# Patient Record
Sex: Female | Born: 1950
Health system: Southern US, Community
[De-identification: ages and names within clinical notes are randomized; demographics above are authoritative.]

## PROBLEM LIST (undated history)

## (undated) DIAGNOSIS — F32A Depression, unspecified: Secondary | ICD-10-CM

## (undated) DIAGNOSIS — N182 Chronic kidney disease, stage 2 (mild): Secondary | ICD-10-CM

## (undated) DIAGNOSIS — I10 Essential (primary) hypertension: Secondary | ICD-10-CM

## (undated) DIAGNOSIS — I6529 Occlusion and stenosis of unspecified carotid artery: Secondary | ICD-10-CM

## (undated) DIAGNOSIS — IMO0001 Reserved for inherently not codable concepts without codable children: Secondary | ICD-10-CM

## (undated) DIAGNOSIS — R8281 Pyuria: Secondary | ICD-10-CM

## (undated) DIAGNOSIS — R310 Gross hematuria: Secondary | ICD-10-CM

## (undated) DIAGNOSIS — R030 Elevated blood-pressure reading, without diagnosis of hypertension: Secondary | ICD-10-CM

## (undated) DIAGNOSIS — R351 Nocturia: Secondary | ICD-10-CM

## (undated) DIAGNOSIS — K130 Diseases of lips: Secondary | ICD-10-CM

## (undated) DIAGNOSIS — F419 Anxiety disorder, unspecified: Secondary | ICD-10-CM

## (undated) DIAGNOSIS — E785 Hyperlipidemia, unspecified: Secondary | ICD-10-CM

## (undated) DIAGNOSIS — R0989 Other specified symptoms and signs involving the circulatory and respiratory systems: Secondary | ICD-10-CM

## (undated) DIAGNOSIS — F329 Major depressive disorder, single episode, unspecified: Secondary | ICD-10-CM

## (undated) DIAGNOSIS — K5730 Diverticulosis of large intestine without perforation or abscess without bleeding: Secondary | ICD-10-CM

## (undated) DIAGNOSIS — M5430 Sciatica, unspecified side: Secondary | ICD-10-CM

## (undated) DIAGNOSIS — E559 Vitamin D deficiency, unspecified: Secondary | ICD-10-CM

## (undated) DIAGNOSIS — E538 Deficiency of other specified B group vitamins: Secondary | ICD-10-CM

## (undated) HISTORY — DX: Occlusion and stenosis of unspecified carotid artery: I65.29

## (undated) HISTORY — DX: Vitamin D deficiency, unspecified: E55.9

## (undated) HISTORY — DX: Diseases of lips: K13.0

## (undated) HISTORY — DX: Major depressive disorder, single episode, unspecified: F32.9

## (undated) HISTORY — DX: Sciatica, unspecified side: M54.30

## (undated) HISTORY — DX: Hyperlipidemia, unspecified: E78.5

## (undated) HISTORY — DX: Reserved for inherently not codable concepts without codable children: IMO0001

## (undated) HISTORY — DX: Other specified symptoms and signs involving the circulatory and respiratory systems: R09.89

## (undated) HISTORY — DX: Essential (primary) hypertension: I10

## (undated) HISTORY — DX: Gross hematuria: R31.0

## (undated) HISTORY — DX: Depression, unspecified: F32.A

## (undated) HISTORY — DX: Nocturia: R35.1

## (undated) HISTORY — DX: Anxiety disorder, unspecified: F41.9

## (undated) HISTORY — DX: Chronic kidney disease, stage 2 (mild): N18.2

## (undated) HISTORY — DX: Deficiency of other specified B group vitamins: E53.8

## (undated) HISTORY — DX: Elevated blood-pressure reading, without diagnosis of hypertension: R03.0

## (undated) HISTORY — DX: Diverticulosis of large intestine without perforation or abscess without bleeding: K57.30

## (undated) HISTORY — DX: Pyuria: R82.81

---
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
## 1985-03-06 HISTORY — PX: ABDOMINAL HYSTERECTOMY: SHX81

## 1986-03-06 HISTORY — PX: CARPAL TUNNEL RELEASE: SHX101

## 1986-03-06 HISTORY — PX: EXCISION NEUROMA: SHX6350

## 2013-09-03 ENCOUNTER — Ambulatory Visit: Payer: Self-pay | Admitting: Family Medicine

## 2013-12-04 DIAGNOSIS — K5730 Diverticulosis of large intestine without perforation or abscess without bleeding: Secondary | ICD-10-CM

## 2013-12-04 HISTORY — DX: Diverticulosis of large intestine without perforation or abscess without bleeding: K57.30

## 2013-12-10 DIAGNOSIS — E7800 Pure hypercholesterolemia, unspecified: Secondary | ICD-10-CM | POA: Insufficient documentation

## 2013-12-10 DIAGNOSIS — R0789 Other chest pain: Secondary | ICD-10-CM | POA: Insufficient documentation

## 2013-12-10 DIAGNOSIS — F331 Major depressive disorder, recurrent, moderate: Secondary | ICD-10-CM | POA: Insufficient documentation

## 2013-12-24 ENCOUNTER — Ambulatory Visit: Payer: Self-pay | Admitting: Family Medicine

## 2014-10-19 ENCOUNTER — Telehealth: Payer: Self-pay | Admitting: Family Medicine

## 2014-10-19 NOTE — Telephone Encounter (Signed)
Pt called in stated that she woke up Saturday and her bottom lip was swollen and would like a call back about what to do.

## 2014-10-20 NOTE — Telephone Encounter (Signed)
This is difficult to discern over the phone Could be aphthous ulcer, herpes simplex, or she could have bitten the inside of her mouth in her sleep, etc. If fever or purulent drainage, needs to be seen right away If painful, she can try Oragel or similar OTC medicine for gum and oral pain If lips are swollen and tongue swollen and trouble breathing, then that's a reason to go to ER We're happy to see her or she can go to urgent care if not improving

## 2014-10-20 NOTE — Telephone Encounter (Signed)
Spoke with patient, she has no fever, no drainage, no swelling of her tongue, no SOB or breathing difficulties. She will try oragel and if no improvement, will call back to schedule an appointment.

## 2014-10-20 NOTE — Telephone Encounter (Signed)
Bottom lip on left side has been swollen since Saturday. No SOB. Slightly tender to the touch. No new lipsticks/chapsticks, etc. Wants advice on what to do.

## 2014-11-30 DIAGNOSIS — I10 Essential (primary) hypertension: Secondary | ICD-10-CM | POA: Insufficient documentation

## 2014-11-30 DIAGNOSIS — I6529 Occlusion and stenosis of unspecified carotid artery: Secondary | ICD-10-CM | POA: Insufficient documentation

## 2014-11-30 DIAGNOSIS — R0989 Other specified symptoms and signs involving the circulatory and respiratory systems: Secondary | ICD-10-CM | POA: Insufficient documentation

## 2014-11-30 DIAGNOSIS — E538 Deficiency of other specified B group vitamins: Secondary | ICD-10-CM | POA: Insufficient documentation

## 2014-11-30 DIAGNOSIS — E785 Hyperlipidemia, unspecified: Secondary | ICD-10-CM | POA: Insufficient documentation

## 2014-11-30 DIAGNOSIS — F329 Major depressive disorder, single episode, unspecified: Secondary | ICD-10-CM | POA: Insufficient documentation

## 2014-11-30 DIAGNOSIS — K130 Diseases of lips: Secondary | ICD-10-CM | POA: Insufficient documentation

## 2014-11-30 DIAGNOSIS — F32A Depression, unspecified: Secondary | ICD-10-CM | POA: Insufficient documentation

## 2014-11-30 DIAGNOSIS — E559 Vitamin D deficiency, unspecified: Secondary | ICD-10-CM | POA: Insufficient documentation

## 2014-12-03 ENCOUNTER — Encounter: Payer: Self-pay | Admitting: Family Medicine

## 2014-12-03 ENCOUNTER — Ambulatory Visit (INDEPENDENT_AMBULATORY_CARE_PROVIDER_SITE_OTHER): Payer: Medicare Other | Admitting: Family Medicine

## 2014-12-03 VITALS — BP 134/84 | HR 90 | Temp 98.1°F | Ht 58.75 in | Wt 142.0 lb

## 2014-12-03 DIAGNOSIS — I10 Essential (primary) hypertension: Secondary | ICD-10-CM

## 2014-12-03 DIAGNOSIS — E785 Hyperlipidemia, unspecified: Secondary | ICD-10-CM | POA: Diagnosis not present

## 2014-12-03 DIAGNOSIS — Z23 Encounter for immunization: Secondary | ICD-10-CM

## 2014-12-03 DIAGNOSIS — M7062 Trochanteric bursitis, left hip: Secondary | ICD-10-CM | POA: Diagnosis not present

## 2014-12-03 DIAGNOSIS — I6523 Occlusion and stenosis of bilateral carotid arteries: Secondary | ICD-10-CM | POA: Diagnosis not present

## 2014-12-03 DIAGNOSIS — E559 Vitamin D deficiency, unspecified: Secondary | ICD-10-CM

## 2014-12-03 DIAGNOSIS — E538 Deficiency of other specified B group vitamins: Secondary | ICD-10-CM | POA: Diagnosis not present

## 2014-12-03 DIAGNOSIS — Z5181 Encounter for therapeutic drug level monitoring: Secondary | ICD-10-CM | POA: Diagnosis not present

## 2014-12-03 DIAGNOSIS — M7061 Trochanteric bursitis, right hip: Secondary | ICD-10-CM | POA: Diagnosis not present

## 2014-12-03 NOTE — Assessment & Plan Note (Addendum)
Recheck lipids today and sgpt on 80 mg atorvastatin; goal LDL less than 70; healthy eating discussed

## 2014-12-03 NOTE — Patient Instructions (Addendum)
If you need something for aches or pains, try to use Tylenol (acetaminphen) instead of non-steroidals (which include Aleve, ibuprofen, Advil, Motrin, and naproxen); non-steroidals can cause long-term kidney damage Try turmeric as a natural anti-inflammatory (for pain and arthritis). It comes in capsules where you buy aspirin and fish oil, but also as a spice where you buy pepper and garlic powder. Try to sleep on your back for a while until your hips feel better; you can try the tennis ball in the sock trick on the sleeves of your nightgown Ice topically over the hips for 15-20 minutes 3-4 times a day; thin cloth between ice and skin to avoid thermal injury Try to associate your pills with a well-established habit Try to limit saturated fats in your diet (bacon, sausage) Your goal blood pressure is less than 150 mmHg on top. Try to follow the DASH guidelines (DASH stands for Dietary Approaches to Stop Hypertension) Try to limit the sodium in your diet.  Ideally, consume less than 1.5 grams (less than 1,500mg ) per day. Do not add salt when cooking or at the table.  Check the sodium amount on labels when shopping, and choose items lower in sodium when given a choice. Avoid or limit foods that already contain a lot of sodium. Eat a diet rich in fruits and vegetables and whole grains. Please schedule a Medicare Wellness visit in the next few months You received the pneumonia vaccine called Prevnar, PCV-13 today; you do not need a booster of that for the rest of your life You should receive a different type of pneumonia vaccine called Pneumovax, PPSV-23 in 6-12 months You received the flu shot today; it should protect you against the flu virus over the coming months; it will take about two weeks for antibodies to develop; do try to stay away from hospitals, nursing homes, and daycares during peak flu season; taking 1000 mg of vitamin C daily during flu season may help you avoid getting sick I've ordered the  carotid scan If you have not heard anything from my staff in a week about any orders/referrals/studies from today, please contact us here to follow-up [phone]  Return in 6 months for regular follow-up and cholesterol  DASH Eating Plan DASH stands for "Dietary Approaches to Stop Hypertension." The DASH eating plan is a healthy eating plan that has been shown to reduce high blood pressure (hypertension). Additional health benefits may include reducing the risk of type 2 diabetes mellitus, heart disease, and stroke. The DASH eating plan may also help with weight loss. WHAT DO I NEED TO KNOW ABOUT THE DASH EATING PLAN? For the DASH eating plan, you will follow these general guidelines:  Choose foods with a percent daily value for sodium of less than 5% (as listed on the food label).  Use salt-free seasonings or herbs instead of table salt or sea salt.  Check with your health care provider or pharmacist before using salt substitutes.  Eat lower-sodium products, often labeled as "lower sodium" or "no salt added."  Eat fresh foods.  Eat more vegetables, fruits, and low-fat dairy products.  Choose whole grains. Look for the word "whole" as the first word in the ingredient list.  Choose fish and skinless chicken or [place] more often than red meat. Limit fish, poultry, and meat to 6 oz (170 g) each day.  Limit sweets, desserts, sugars, and sugary drinks.  Choose heart-healthy fats.  Limit cheese to 1 oz (28 g) per day.  Eat more home-cooked food  and less restaurant, buffet, and fast food.  Limit fried foods.  Cook foods using methods other than frying.  Limit canned vegetables. If you do use them, rinse them well to decrease the sodium.  When eating at a restaurant, ask that your food be prepared with less salt, or no salt if possible. WHAT FOODS CAN I EAT? Seek help from a dietitian for individual calorie needs. Grains Whole grain or whole wheat bread. Brown rice. Whole  grain or whole wheat pasta. Quinoa, bulgur, and whole grain cereals. Low-sodium cereals. Corn or whole wheat flour tortillas. Whole grain cornbread. Whole grain crackers. Low-sodium crackers. Vegetables Fresh or frozen vegetables (raw, steamed, roasted, or grilled). Low-sodium or reduced-sodium tomato and vegetable juices. Low-sodium or reduced-sodium tomato sauce and paste. Low-sodium or reduced-sodium canned vegetables.  Fruits All fresh, canned (in natural juice), or frozen fruits. Meat and Other Protein Products Ground beef (85% or leaner), grass-fed beef, or beef trimmed of fat. Skinless chicken or [place]. Ground chicken or [place]. Pork trimmed of fat. All fish and seafood. Eggs. Dried beans, peas, or lentils. Unsalted nuts and seeds. Unsalted canned beans. Dairy Low-fat dairy products, such as skim or 1% milk, 2% or reduced-fat cheeses, low-fat ricotta or cottage cheese, or plain low-fat yogurt. Low-sodium or reduced-sodium cheeses. Fats and Oils Tub margarines without trans fats. Light or reduced-fat mayonnaise and salad dressings (reduced sodium). Avocado. Safflower, olive, or canola oils. Natural peanut or almond butter. Other Unsalted popcorn and pretzels. The items listed above may not be a complete list of recommended foods or beverages. Contact your dietitian for more options. WHAT FOODS ARE NOT RECOMMENDED? Grains White bread. White pasta. White rice. Refined cornbread. Bagels and croissants. Crackers that contain trans fat. Vegetables Creamed or fried vegetables. Vegetables in a cheese sauce. Regular canned vegetables. Regular canned tomato sauce and paste. Regular tomato and vegetable juices. Fruits Dried fruits. Canned fruit in light or heavy syrup. Fruit juice. Meat and Other Protein Products Fatty cuts of meat. Ribs, chicken wings, bacon, sausage, bologna, salami, chitterlings, fatback, hot dogs, bratwurst, and packaged luncheon meats. Salted nuts and seeds. Canned beans with  salt. Dairy Whole or 2% milk, cream, half-and-half, and cream cheese. Whole-fat or sweetened yogurt. Full-fat cheeses or blue cheese. Nondairy creamers and whipped toppings. Processed cheese, cheese spreads, or cheese curds. Condiments Onion and garlic salt, seasoned salt, table salt, and sea salt. Canned and packaged gravies. Worcestershire sauce. Tartar sauce. Barbecue sauce. Teriyaki sauce. Soy sauce, including reduced sodium. Steak sauce. Fish sauce. Oyster sauce. Cocktail sauce. Horseradish. Ketchup and mustard. Meat flavorings and tenderizers. Bouillon cubes. Hot sauce. Tabasco sauce. Marinades. Taco seasonings. Relishes. Fats and Oils Butter, stick margarine, lard, shortening, ghee, and bacon fat. Coconut, palm kernel, or palm oils. Regular salad dressings. Other Pickles and olives. Salted popcorn and pretzels. The items listed above may not be a complete list of foods and beverages to avoid. Contact your dietitian for more information. WHERE CAN I FIND MORE INFORMATION? National Heart, Lung, and Blood Institute: travelstabloid.com Document Released: 02/09/2011 Document Revised: 07/07/2013 Document Reviewed: 12/25/2012 [facility] Patient Information 2015 [place], [place]. This information is not intended to replace advice given to you by your health care provider. Make sure you discuss any questions you have with your health care provider. Cholesterol Cholesterol is a white, waxy, fat-like substance needed by your body in small amounts. The liver makes all the cholesterol you need. Cholesterol is carried from the liver by the blood through the blood vessels. Deposits of cholesterol (plaque)  may build up on blood vessel walls. These make the arteries narrower and stiffer. Cholesterol plaques increase the risk for heart attack and stroke.  You cannot feel your cholesterol level even if it is very high. The only way to know it is high is with a blood test. Once you  know your cholesterol levels, you should keep a record of the test results. Work with your health care provider to keep your levels in the desired range.  WHAT DO THE RESULTS MEAN?  Total cholesterol is a rough measure of all the cholesterol in your blood.   LDL is the so-called bad cholesterol. This is the type that deposits cholesterol in the walls of the arteries. You want this level to be low.   HDL is the good cholesterol because it cleans the arteries and carries the LDL away. You want this level to be high.  Triglycerides are fat that the body can either burn for energy or store. High levels are closely linked to heart disease.  WHAT ARE THE DESIRED LEVELS OF CHOLESTEROL?  Total cholesterol below 200.   LDL below 100 for people at risk, below 70 for those at very high risk.   HDL above 50 is good, above 60 is best.   Triglycerides below 150.  HOW CAN I LOWER MY CHOLESTEROL?  Diet. Follow your diet programs as directed by your health care provider.   Choose fish or white meat chicken and [place], roasted or baked. Limit fatty cuts of red meat, fried foods, and processed meats, such as sausage and lunch meats.   Eat lots of fresh fruits and vegetables.  Choose whole grains, beans, pasta, potatoes, and cereals.   Use only small amounts of olive, corn, or canola oils.   Avoid butter, mayonnaise, shortening, or palm kernel oils.  Avoid foods with trans fats.   Drink skim or nonfat milk and eat low-fat or nonfat yogurt and cheeses. Avoid whole milk, cream, ice cream, egg yolks, and full-fat cheeses.   Healthy desserts include angel food cake, ginger snaps, animal crackers, hard candy, popsicles, and low-fat or nonfat frozen yogurt. Avoid pastries, cakes, pies, and cookies.   Exercise. Follow your exercise programs as directed by your health care provider.   A regular program helps decrease LDL and raise HDL.   A regular program helps with weight control.    Do things that increase your activity level like gardening, walking, or taking the stairs. Ask your health care provider about how you can be more active in your daily life.   Medicine. Take medicine only as directed by your health care provider.   Medicine may be prescribed by your health care provider to help lower cholesterol and decrease the risk for heart disease.   If you have several risk factors, you may need medicine even if your levels are normal. Document Released: 11/15/2000 Document Revised: 07/07/2013 Document Reviewed: 12/04/2012 [facility] Patient Information 2015 [place], [place]. This information is not intended to replace advice given to you by your health care provider. Make sure you discuss any questions you have with your health care provider.

## 2014-12-03 NOTE — Assessment & Plan Note (Signed)
Aspirin, statin, goal LDL ideally under 70; healthy diet encouraged

## 2014-12-03 NOTE — Progress Notes (Signed)
BP 134/84 mmHg  Pulse 90  Temp(Src) 98.1 F (36.7 C)  Ht 4' 10.75" (1.492 m)  Wt 142 lb (64.411 kg)  BMI 28.93 kg/m2  SpO2 98%   Subjective:    Patient ID: Sarah Gillespie, female    DOB: 06/18/1950, 64 y.o.   MRN: [id number]  HPI: Sarah Gillespie is a 64 y.o. female  Chief Complaint  Patient presents with  . Hypertension  . Hyperlipidemia  . Depression   She is here for follow-up of high cholesterol; last labs were checked in May, reviewed lipid panel with her; total cholesterol was 256 and LDL was 168; she is now on atorvastatin 80 mg daily; previous doses were 20 mg daily and then 40 mg daily; she is not having any problems with her medicines (no muscle aches, no abdominal pain); she misses her doses about half the time, forgets to take it a lot Diet review: hardly eats any eggs, just in baking; bacon once in a while, maybe once a week; does eat cheese, ham and cheese for lunch a few times a week; drinks whole milk just seldomly, baking or pudding or potatoes; does get a lot of veggies  Activity review: pretty sedentary because of the heat, but got a bicycle for Mother's Day; outdoor bike; does have a helmet  She has bilateral carotid atherosclerosis, mild on the right, minimal plaque on the left per carotid US report dated September 03, 2013; she has been stepped up on her statin since that last scan; no dizziness or headaches  Depression; chronic problem; on treatment right now, Pristiq and Wellbutrin; under good control with both medicines Depression screen [facility] 2/9 12/03/2014  Decreased Interest 0  Down, Depressed, Hopeless 0  PHQ - 2 Score 0   High blood pressure; last BP was 133/79 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[employer], broadcasting/film/video; she was talking about the rioting right before the BP was checked  Vitamin D deficiency and B12 deficiency; both mild; last labs in May showed vitamin D level of 27 and a B12 level of 373; she is taking supplements for both  Flu shot given today  Had a lot of pain on her  left side a few years ago; went to the arthritis doctor and he said it was her bursa, now on both hips; she had a positive ANA; both left and right are equal; first thought maybe change in the weather; hard to sleep, hurts when she lays on the side to sleep; she has tried ice; has not tried heat; she has tried tylenol and that helps a little  Update to family hx; niece has MS Sister had carotid blockages and polymyositis; other sister has thyroid disease  Relevant past medical, surgical, family and social history reviewed and updated as indicated. Interim medical history since our last visit reviewed. Allergies and medications reviewed and updated.  Review of Systems  Respiratory: Negative for shortness of breath.   Cardiovascular: Negative for chest pain.   Per HPI unless specifically indicated above     Objective:    BP 134/84 mmHg  Pulse 90  Temp(Src) 98.1 F (36.7 C)  Ht 4' 10.75" (1.492 m)  Wt 142 lb (64.411 kg)  BMI 28.93 kg/m2  SpO2 98%  Wt Readings from Last 3 Encounters:  12/03/14 142 lb (64.411 kg)  07/29/14 143 lb (64.864 kg)    Today's Vitals   12/03/14 1116 12/03/14 1203  BP: 166/89 134/84  Pulse: 90   Temp: 98.1 F (36.7 C)   Height: 4'  10.75" (1.492 m)   Weight: 142 lb (64.411 kg)   SpO2: 98%     Physical Exam  Constitutional: She appears well-developed and well-nourished. No distress.  HENT:  Head: Normocephalic and atraumatic.  Mouth/Throat: Mucous membranes are normal. Abnormal dentition (decay). Dental caries present.  Eyes: EOM are normal. No scleral icterus.  Neck: Carotid bruit is not present. No thyromegaly present.  Cardiovascular: Normal rate, regular rhythm and normal heart sounds.   No murmur heard. Pulmonary/Chest: Effort normal and breath sounds normal. No respiratory distress. She has no wheezes.  Abdominal: Soft. Bowel sounds are normal. She exhibits no distension.  Musculoskeletal: Normal range of motion. She exhibits no edema.        Right hip: She exhibits tenderness (over greater trochanter).       Left hip: She exhibits tenderness (over greater trochanter).  Neurological: She is alert. She exhibits normal muscle tone.  Skin: Skin is warm and dry. She is not diaphoretic. No pallor.  Psychiatric: She has a normal mood and affect. Her behavior is normal. Judgment and thought content normal.      Assessment & Plan:   Problem List Items Addressed This Visit      Cardiovascular and Mediastinum   Carotid atherosclerosis    Aspirin, statin, goal LDL ideally under 70; healthy diet encouraged      Relevant Orders   US Carotid Duplex Bilateral   Essential hypertension, benign    Recheck improved, controlled; continue med        Digestive   Vitamin B12 deficiency    Continue supplementation        Musculoskeletal and Integument   Trochanteric bursitis of both hips    Turmeric, tylenol, topicals; she may return to see arthritis doctor if desired        Other   Hyperlipidemia - Primary    Recheck lipids today and sgpt on 80 mg atorvastatin; goal LDL less than 70; healthy eating discussed      Relevant Orders   Lipid Panel w/o Chol/HDL Ratio   Vitamin D deficiency disease    Continue supplementation      Vaccine for streptococcus pneumoniae and influenza    Recommended PCV-13 today; given; recommended flu vaccine today; given; next flu shot in fall of 2017; she will not need another PCV-13 for life (per current ACIP guidelines)      Relevant Orders   Pneumococcal conjugate vaccine 13-valent (Completed)    Other Visit Diagnoses    Encounter for immunization        Medication monitoring encounter        Relevant Orders    Comprehensive metabolic panel (Completed)       Follow up plan: Return in about 6 months (around 06/02/2015) for regular visit and labs, but also schedule Medicare Wellness visit in next few months.  Orders Placed This Encounter  Procedures  . US Carotid Duplex Bilateral  . Flu  Vaccine QUAD 36+ mos IM  . Pneumococcal conjugate vaccine 13-valent  . Lipid Panel w/o Chol/HDL Ratio  . Comprehensive metabolic panel  . Lipid Panel w/o Chol/HDL Ratio   An after-visit summary was printed and given to the patient at [place].  Please see the patient instructions which may contain other information and recommendations beyond what is mentioned above in the assessment and plan.

## 2014-12-04 LAB — COMPREHENSIVE METABOLIC PANEL
A/G RATIO: 1.6 (ref 1.1–2.5)
ALBUMIN: 4.1 g/dL (ref 3.6–4.8)
ALK PHOS: 101 IU/L (ref 39–117)
ALT: 9 IU/L (ref 0–32)
AST: 15 IU/L (ref 0–40)
BILIRUBIN TOTAL: 0.3 mg/dL (ref 0.0–1.2)
BUN / CREAT RATIO: 21 (ref 11–26)
BUN: 22 mg/dL (ref 8–27)
CO2: 24 mmol/L (ref 18–29)
Calcium: 10.4 mg/dL — ABNORMAL HIGH (ref 8.7–10.3)
Chloride: 101 mmol/L (ref 97–108)
Creatinine, Ser: 1.03 mg/dL — ABNORMAL HIGH (ref 0.57–1.00)
GFR calc Af Amer: 65 mL/min/{1.73_m2} (ref >59)
GFR calc non Af Amer: 57 mL/min/{1.73_m2} — ABNORMAL LOW (ref >59)
GLUCOSE: 90 mg/dL (ref 65–99)
Globulin, Total: 2.6 g/dL (ref 1.5–4.5)
Potassium: 4.6 mmol/L (ref 3.5–5.2)
Sodium: 141 mmol/L (ref 134–144)
Total Protein: 6.7 g/dL (ref 6.0–8.5)

## 2014-12-04 LAB — LIPID PANEL W/O CHOL/HDL RATIO
Cholesterol, Total: 266 mg/dL — ABNORMAL HIGH (ref 100–199)
HDL: 61 mg/dL (ref >39)
LDL CALC: 189 mg/dL — AB (ref 0–99)
TRIGLYCERIDES: 79 mg/dL (ref 0–149)
VLDL Cholesterol Cal: 16 mg/dL (ref 5–40)

## 2014-12-07 DIAGNOSIS — M7061 Trochanteric bursitis, right hip: Secondary | ICD-10-CM | POA: Insufficient documentation

## 2014-12-07 DIAGNOSIS — M7062 Trochanteric bursitis, left hip: Secondary | ICD-10-CM

## 2014-12-07 NOTE — Assessment & Plan Note (Signed)
Continue supplementation  ?

## 2014-12-07 NOTE — Assessment & Plan Note (Signed)
Recommended PCV-13 today; given; recommended flu vaccine today; given; next flu shot in fall of 2017; she will not need another PCV-13 for life (per current ACIP guidelines)

## 2014-12-07 NOTE — Assessment & Plan Note (Signed)
Turmeric, tylenol, topicals; she may return to see arthritis doctor if desired

## 2014-12-07 NOTE — Assessment & Plan Note (Signed)
Recheck improved, controlled; continue med

## 2014-12-16 ENCOUNTER — Ambulatory Visit: Payer: Medicare Other

## 2014-12-16 ENCOUNTER — Ambulatory Visit (INDEPENDENT_AMBULATORY_CARE_PROVIDER_SITE_OTHER): Payer: Medicare Other | Admitting: Family Medicine

## 2014-12-16 ENCOUNTER — Encounter: Payer: Self-pay | Admitting: Family Medicine

## 2014-12-16 VITALS — BP 154/97 | HR 105 | Temp 98.9°F | Ht 59.0 in | Wt 143.0 lb

## 2014-12-16 DIAGNOSIS — Z1239 Encounter for other screening for malignant neoplasm of breast: Secondary | ICD-10-CM | POA: Diagnosis not present

## 2014-12-16 DIAGNOSIS — Z780 Asymptomatic menopausal state: Secondary | ICD-10-CM | POA: Diagnosis not present

## 2014-12-16 DIAGNOSIS — Z0000 Encounter for general adult medical examination without abnormal findings: Secondary | ICD-10-CM

## 2014-12-16 MED ORDER — LISINOPRIL 10 MG PO TABS: 10.0000 mg | ORAL_TABLET | Freq: Every day | ORAL | Status: DC

## 2014-12-16 MED ORDER — TRAMADOL HCL 50 MG PO TABS: 50.0000 mg | ORAL_TABLET | Freq: Four times a day (QID) | ORAL | Status: DC | PRN

## 2014-12-16 NOTE — Progress Notes (Signed)
Patient: Sarah Gillespie, Female    DOB: [birth year], 64 y.o.   MRN: [id number]  Visit Date: 12/16/2014  Today's Provider: Enid Derry, MD   Chief Complaint  Patient presents with  . Annual Exam    Medicare Annual Wellness Exam    Subjective:   Sarah Gillespie is a 64 y.o. female who presents today for her Subsequent Annual Wellness Visit.  Caregiver input: n/a   She has a new method for remembering cholesterol medicine; last lipid panel was with several missed doses and we'll recheck in 3 months now that she's back on her medicine regularly; we reviewed her last LDL which was high (189 on Sept 29, 2016) BP high today Pain in the hips and radiating down the right leg; goes to the ankle; tried tylenol; no weakness or numbness or tingling; just hurts, worse with standing; no rash; no bowel or bladder dysfunction  Alcohol: occasional alcohol, less than guidelines Depression screen [facility] 2/9 12/16/2014 12/03/2014  Decreased Interest 0 0  Down, Depressed, Hopeless 0 0  PHQ - 2 Score 0 0  HTN: high today; checks away from doctor, but can't remember numbers; family hx is positive, mother had HTN Obesity: n/a, mildly overweight but gaining a little; sister has thyroid problems, father had thyroid problems Tobacco: n/a HIV, hepatitis B, hep C: declined Cervical cancer: s/p hysterectomy, endometriosis Lipids and glucose: recently done and discussed Colon cancer: patient declined; gave her stool cards DEXA: ordered Breast cancer: ordered AAA: n/a Aspirin; already taking Fall prevention; taking vitamin D, practices fall precautions Diet: typical American eater or worse Skin cancer: no tanning beds, does not bake in the sun Exercise: not plenty of activity, getting ready to ride bike  HPI  Review of Systems  Past Medical History  Diagnosis Date  . Hyperlipidemia   . Depression   . Angular cheilitis   . Vitamin B12 deficiency   . Vitamin D deficiency disease   . Carotid bruit   .  Carotid atherosclerosis   . Elevated blood pressure   . Sigmoid diverticulosis Oct. 2015    CT scan   Past Surgical History  Procedure Laterality Date  . Appendectomy  1960  . Abdominal hysterectomy  1987  . Tonsillectomy  1962  . Carpal tunnel release Left 1988  . Excision neuroma Left 1988   Family History  Problem Relation Age of Onset  . Hypertension Mother   . Heart disease Mother   . Clotting disorder Mother   . Other Sister     muscle disease  . Cancer Brother 50    lung, brain cancer  . ALS Father   . Diabetes Maternal Grandmother   . Stroke Brother   . Thyroid disease Sister   . Clotting disorder Sister     Social History   Social History  . Marital Status: Unknown    Spouse Name: N/A  . Number of Children: N/A  . Years of Education: N/A   Occupational History  . Not on file.   Social History Main Topics  . Smoking status: Never Smoker   . Smokeless tobacco: Never Used  . Alcohol Use: Yes     Comment: occasional wine  . Drug Use: No  . Sexual Activity: Not on file   Other Topics Concern  . Not on file   Social History Narrative    Outpatient Encounter Prescriptions as of 12/16/2014  Medication Sig  . aspirin EC 81 MG tablet Take 81 mg by mouth daily.  [place] Kitchen atorvastatin (  LIPITOR) 80 MG tablet Take 80 mg by mouth at bedtime.  Mar[place] Kitchen buPROPion (WELLBUTRIN XL) 300 MG 24 hr tablet Take 300 mg by mouth daily.  Marland Kitchen CALCIUM PO Take by mouth daily.   . Cholecalciferol (VITAMIN D) 2000 UNITS CAPS Take 2,000 Units by mouth daily.  Marland Kitchen desvenlafaxine (PRISTIQ) 100 MG 24 hr tablet Take 100 mg by mouth daily.  Marland Kitchen nystatin cream (MYCOSTATIN) Apply 1 application topically 2 (two) times daily as needed for dry skin.   No facility-administered encounter medications on file as of 12/16/2014.    Functional Ability / Safety Screening 1.  Was the timed Get Up and Go test longer than 30 seconds?  no 2.  Does the patient need help with the phone, transportation, shopping,       preparing meals, housework, laundry, medications, or managing money?  no 3.  Does the patient's home have:  loose throw rugs in the hallway?   no      Grab bars in the bathroom? no      Handrails on the stairs?   yes      Poor lighting?   no 4.  Has the patient noticed any hearing difficulties?   no  Fall Risk Assessment See under rooming  Depression Screen See under rooming Depression screen [facility] 2/9 12/16/2014 12/03/2014  Decreased Interest 0 0  Down, Depressed, Hopeless 0 0  PHQ - 2 Score 0 0   Advanced Directives Does patient have a HCPOA?    no If yes, name and contact information: would be Lucrezia Starch, [phone] Does patient have a living will or MOST form?  no  Objective:   Vitals: BP 154/97 mmHg  Pulse 105  Temp(Src) 98.9 F (37.2 C)  Ht 4\' 11"  (1.499 m)  Wt 143 lb (64.864 kg)  BMI 28.87 kg/m2  SpO2 97% Body mass index is 28.87 kg/(m^2).  Visual Acuity Screening   Right eye Left eye Both eyes  Without correction:     With correction: 20/50 20/25     Physical Exam  Constitutional: She appears well-developed and well-nourished.  Cardiovascular: Tachycardia present.   Pulmonary/Chest: Effort normal.  Neurological: She is alert. She displays no atrophy and no tremor. She exhibits normal muscle tone. Coordination and gait normal.  Psychiatric: She has a normal mood and affect. Her speech is normal and behavior is normal. Judgment and thought content normal. Cognition and memory are normal.   Mood/affect:  euthymic Appearance:  Casually dressed  Cognitive Testing - 6-CIT  Correct? Score   What year is it? yes 0 Yes = 0    No = 4  What month is it? yes 0 Yes = 0    No = 3  Remember:     Pia Mau, [place], [place]     What time is it? yes 0 Yes = 0    No = 3  Count backwards from 20 to 1 yes 0 Correct = 0    1 error = 2   More than 1 error = 4  Say the months of the year in reverse. yes 0 Correct = 0    1 error = 2   More than 1 error = 4  What  address did I ask you to remember? no 2 Correct = 0  1 error = 2    2 error = 4    3 error = 6    4 error = 8    All wrong =  10       TOTAL SCORE  2/28   Interpretation:  Normal  Normal (0-7) Abnormal (8-28)    Assessment & Plan:     Annual Wellness Visit  Reviewed patient's Family Medical History Reviewed and updated list of patient's medical providers Assessment of cognitive impairment was done Assessed patient's functional ability Established a written schedule for health screening [place] Completed and Reviewed  Exercise Activities and Dietary recommendations Goals    None      Immunization History  Administered Date(s) Administered  . Influenza,inj,Quad PF,36+ Mos 12/03/2014  . Pneumococcal Conjugate-13 12/03/2014  . Zoster 05/22/2013    Health Maintenance  Topic Date Due  . Hepatitis C Screening  February 06, 1951  . TETANUS/TDAP  09/03/1967  . MAMMOGRAM  09/03/1998  . DEXA SCAN  09/02/2013  . COLONOSCOPY  12/16/2015 (Originally 09/03/1998)  . INFLUENZA VACCINE  10/05/2015  . PNA vac Low Risk Adult (2 of 2 - PPSV23) 12/03/2015  . ZOSTAVAX  Completed     Discussed health benefits of physical activity, and encouraged her to engage in regular exercise appropriate for her age and condition.   No orders of the defined types were placed in this encounter.    Current outpatient prescriptions:  .  aspirin EC 81 MG tablet, Take 81 mg by mouth daily., Disp: , Rfl:  .  atorvastatin (LIPITOR) 80 MG tablet, Take 80 mg by mouth at bedtime., Disp: , Rfl:  .  buPROPion (WELLBUTRIN XL) 300 MG 24 hr tablet, Take 300 mg by mouth daily., Disp: , Rfl:  .  CALCIUM PO, Take by mouth daily. , Disp: , Rfl:  .  Cholecalciferol (VITAMIN D) 2000 UNITS CAPS, Take 2,000 Units by mouth daily., Disp: , Rfl:  .  desvenlafaxine (PRISTIQ) 100 MG 24 hr tablet, Take 100 mg by mouth daily., Disp: , Rfl:  .  nystatin cream (MYCOSTATIN), Apply 1 application topically 2 (two)  times daily as needed for dry skin., Disp: , Rfl:  There are no discontinued medications.  Next Medicare Wellness Visit in 12+ months  Problem List Items Addressed This Visit      Other   Breast cancer screening   Relevant Orders   MM DIGITAL SCREENING BILATERAL   Postmenopausal   Relevant Orders   DG Bone Density    Other Visit Diagnoses    Visit for preventive health examination    -  Primary

## 2014-12-16 NOTE — Patient Instructions (Addendum)
Let's start the new blood pressure medicine Avoid fruit smoothies and salt substitutes Return in 3 weeks for blood pressure check and BMP and thyroid test Try the DASH guidelines  Please do call to schedule your mammogram; the number to schedule one at either [facility] or [facility] is [phone]  Return the stool cards at your earliest convenience; if you change your mind and decide you are willing to have a colonoscopy, just call me and we'll get that scheduled  GINA --> please give patient the living will, health care power of attorney paperwork  If you get injured, please make an appointment right away to come in for a tetanus booster, or you can go to an urgent care  Try the new pain medicine and use if needed; if you develop signs or symptoms of serotonin syndrome, then seek medical care immediately

## 2014-12-22 ENCOUNTER — Telehealth: Payer: Self-pay

## 2014-12-22 NOTE — Telephone Encounter (Signed)
I called to ask patient if she's had her Diabetic Eye Exam recently. She stated she had her eye exam this year at [facility] in [place] and got new glasses.  I contacted [facility] and asked them to fax over her last diabetic eye exam. She agreed and said she'd send it to [place] shortly.

## 2014-12-23 ENCOUNTER — Telehealth: Payer: Self-pay | Admitting: Family Medicine

## 2014-12-23 ENCOUNTER — Ambulatory Visit
Admission: RE | Admit: 2014-12-23 | Discharge: 2014-12-23 | Disposition: A | Discharge status: Home / Self Care | Payer: Medicare Other | Source: Ambulatory Visit | Attending: Family Medicine | Admitting: Family Medicine

## 2014-12-23 DIAGNOSIS — I6523 Occlusion and stenosis of bilateral carotid arteries: Secondary | ICD-10-CM | POA: Insufficient documentation

## 2014-12-23 NOTE — Telephone Encounter (Signed)
I called, left msg that I am calling about carotid scan results; similar to last year; will try her tomorrow IMPRESSION: Minimal to moderate amount of bilateral atherosclerotic plaque, right subjectively greater than left, morphologically similar to the 09/2013 examination and again not resulting in a hemodynamically significant stenosis within either internal carotid artery.

## 2014-12-29 NOTE — Telephone Encounter (Signed)
I left detailed message; similar to last time; limit saturated fats; stay on cholesterol medicine every day; call with questions

## 2015-01-06 ENCOUNTER — Ambulatory Visit: Payer: Medicare Other | Admitting: Family Medicine

## 2015-05-07 ENCOUNTER — Encounter: Payer: Self-pay | Admitting: *Deleted

## 2015-05-20 ENCOUNTER — Encounter: Payer: Self-pay | Admitting: Urology

## 2015-05-20 ENCOUNTER — Ambulatory Visit: Payer: Self-pay | Admitting: Urology

## 2015-06-02 ENCOUNTER — Ambulatory Visit: Payer: Medicare Other | Admitting: Family Medicine

## 2015-07-30 ENCOUNTER — Encounter: Payer: Self-pay | Admitting: Family Medicine

## 2015-07-30 ENCOUNTER — Ambulatory Visit (INDEPENDENT_AMBULATORY_CARE_PROVIDER_SITE_OTHER): Payer: Medicare Other | Admitting: Family Medicine

## 2015-07-30 VITALS — BP 132/78 | HR 93 | Temp 98.3°F | Resp 14 | Wt 145.0 lb

## 2015-07-30 DIAGNOSIS — Z1239 Encounter for other screening for malignant neoplasm of breast: Secondary | ICD-10-CM | POA: Diagnosis not present

## 2015-07-30 DIAGNOSIS — Z0000 Encounter for general adult medical examination without abnormal findings: Secondary | ICD-10-CM | POA: Insufficient documentation

## 2015-07-30 DIAGNOSIS — E538 Deficiency of other specified B group vitamins: Secondary | ICD-10-CM

## 2015-07-30 DIAGNOSIS — Z5181 Encounter for therapeutic drug level monitoring: Secondary | ICD-10-CM | POA: Diagnosis not present

## 2015-07-30 DIAGNOSIS — I6523 Occlusion and stenosis of bilateral carotid arteries: Secondary | ICD-10-CM

## 2015-07-30 DIAGNOSIS — R635 Abnormal weight gain: Secondary | ICD-10-CM

## 2015-07-30 DIAGNOSIS — E785 Hyperlipidemia, unspecified: Secondary | ICD-10-CM

## 2015-07-30 DIAGNOSIS — Z1159 Encounter for screening for other viral diseases: Secondary | ICD-10-CM

## 2015-07-30 DIAGNOSIS — Z780 Asymptomatic menopausal state: Secondary | ICD-10-CM

## 2015-07-30 DIAGNOSIS — E559 Vitamin D deficiency, unspecified: Secondary | ICD-10-CM | POA: Diagnosis not present

## 2015-07-30 DIAGNOSIS — I10 Essential (primary) hypertension: Secondary | ICD-10-CM

## 2015-07-30 MED ORDER — LISINOPRIL 10 MG PO TABS: 10.0000 mg | ORAL_TABLET | Freq: Every day | ORAL | Status: DC

## 2015-07-30 MED ORDER — ATORVASTATIN CALCIUM 80 MG PO TABS: 80.0000 mg | ORAL_TABLET | Freq: Every day | ORAL | Status: DC

## 2015-07-30 NOTE — Assessment & Plan Note (Signed)
Well-controlled; continue ACE-I; follow DASH guidelines

## 2015-07-30 NOTE — Assessment & Plan Note (Signed)
DEXA scan

## 2015-07-30 NOTE — Assessment & Plan Note (Signed)
Order mammo 

## 2015-07-30 NOTE — Progress Notes (Signed)
BP 132/78 mmHg  Pulse 93  Temp(Src) 98.3 F (36.8 C) (Oral)  Resp 14  Wt 145 lb (65.772 kg)  SpO2 94%   Subjective:    Patient ID: Sarah Gillespie, female    DOB: 05/20/1950, 65 y.o.   MRN: [id number]  HPI: Sarah Gillespie is a 65 y.o. female  Chief Complaint  Patient presents with  . Medication Refill    HTN; well-controlled today; occasionally forgets night-time med so asked if okay to take in the morning with her vitamins  High cholesterol; taking cholesterol medicine; last lipids revivewed; total 266, LDL 189, HDL 61  She gradually tapered off her depression medicine and stopped them completely months ago; she feels great She is having trouble remembering her night-time medicines; sleeping better; less constipation  Still having sciatica, from right buttock to ankle; just once in a while; worse with weather changes, rainy weather, dampness; no pain in the middle of the back; no loss of control of bowel or bladder  Carotid athero; nonsmoker; sister had carotid blockages IMPRESSION: Minimal to moderate amount of bilateral atherosclerotic plaque, right subjectively greater than left, morphologically similar to the 09/2013 examination and again not resulting in a hemodynamically significant stenosis within either internal carotid artery.  Electronically Signed  By: Sandi Mariscal M.D.  On: 12/23/2014 16:26 ------------------------  Pain on the right side lateral chest wall; does not feel like it's the breast, but behind and down the side; no rash, does not feel anything in the breast Depression screen [facility] 2/9 07/30/2015 12/16/2014 12/03/2014  Decreased Interest 0 0 0  Down, Depressed, Hopeless 0 0 0  PHQ - 2 Score 0 0 0   Relevant past medical, surgical, family and social history reviewed Past Medical History  Diagnosis Date  . Hyperlipidemia   . Depression   . Angular cheilitis   . Vitamin B12 deficiency   . Vitamin D deficiency disease   . Carotid bruit   .  Carotid atherosclerosis   . Elevated blood pressure   . Sigmoid diverticulosis Oct. 2015    CT scan  . Pyuria   . Anxiety   . Gross hematuria   . Frequency   . Nocturia    Past Surgical History  Procedure Laterality Date  . Appendectomy  1960  . Abdominal hysterectomy  1987  . Tonsillectomy  1962  . Carpal tunnel release Left 1988  . Excision neuroma Left 1988   Family History  Problem Relation Age of Onset  . Hypertension Mother   . Heart disease Mother   . Clotting disorder Mother   . Other Sister     muscle disease  . Cancer Brother 50    lung, brain cancer  . ALS Father   . Urolithiasis Father   . Thyroid disease Father   . Diabetes Maternal Grandmother   . Stroke Brother   . Thyroid disease Sister   . Clotting disorder Sister   MD addition: sister - hypothyroidism  Social History  Substance Use Topics  . Smoking status: Never Smoker   . Smokeless tobacco: Never Used  . Alcohol Use: Yes     Comment: occasional wine   Interim medical history since last visit reviewed. Allergies and medications reviewed  Review of Systems Per HPI unless specifically indicated above     Objective:    BP 132/78 mmHg  Pulse 93  Temp(Src) 98.3 F (36.8 C) (Oral)  Resp 14  Wt 145 lb (65.772 kg)  SpO2 94%  Wt Readings from  Last 3 Encounters:  07/30/15 145 lb (65.772 kg)  12/16/14 143 lb (64.864 kg)  12/03/14 142 lb (64.411 kg)    Physical Exam  Constitutional: She appears well-developed and well-nourished. No distress.  HENT:  Head: Normocephalic and atraumatic.  Mouth/Throat: Abnormal dentition (poor condition).  Eyes: EOM are normal. No scleral icterus.  Neck: Carotid bruit is not present. No thyromegaly present.  Cardiovascular: Normal rate, regular rhythm and normal heart sounds.   No murmur heard. Pulmonary/Chest: Effort normal and breath sounds normal. No respiratory distress. She has no wheezes. She exhibits tenderness (along right upper pectoral minor).  Right breast exhibits no inverted nipple, no mass, no nipple discharge, no skin change and no tenderness. Left breast exhibits no inverted nipple, no mass, no nipple discharge, no skin change and no tenderness. Breasts are symmetrical.  Abdominal: Soft. Bowel sounds are normal. She exhibits no distension.  Musculoskeletal: Normal range of motion. She exhibits no edema.  Neurological: She is alert. She exhibits normal muscle tone.  Skin: Skin is warm and dry. She is not diaphoretic. No pallor.  Psychiatric: She has a normal mood and affect. Her behavior is normal. Judgment and thought content normal.   Results for orders placed or performed in visit on 12/03/14  Comprehensive metabolic panel  Result Value Ref Range   Glucose 90 65 - 99 mg/dL   BUN 22 8 - 27 mg/dL   Creatinine, Ser 1.03 (H) 0.57 - 1.00 mg/dL   GFR calc non Af Amer 57 (L) >59 mL/min/1.73   GFR calc Af Amer 65 >59 mL/min/1.73   BUN/Creatinine Ratio 21 11 - 26   Sodium 141 134 - 144 mmol/L   Potassium 4.6 3.5 - 5.2 mmol/L   Chloride 101 97 - 108 mmol/L   CO2 24 18 - 29 mmol/L   Calcium 10.4 (H) 8.7 - 10.3 mg/dL   Total Protein 6.7 6.0 - 8.5 g/dL   Albumin 4.1 3.6 - 4.8 g/dL   Globulin, Total 2.6 1.5 - 4.5 g/dL   Albumin/Globulin Ratio 1.6 1.1 - 2.5   Bilirubin Total 0.3 0.0 - 1.2 mg/dL   Alkaline Phosphatase 101 39 - 117 IU/L   AST 15 0 - 40 IU/L   ALT 9 0 - 32 IU/L  Lipid Panel w/o Chol/HDL Ratio  Result Value Ref Range   Cholesterol, Total 266 (H) 100 - 199 mg/dL   Triglycerides 79 0 - 149 mg/dL   HDL 61 >39 mg/dL   VLDL Cholesterol Cal 16 5 - 40 mg/dL   LDL Calculated 189 (H) 0 - 99 mg/dL      Assessment & Plan:   Problem List Items Addressed This Visit      Cardiovascular and Mediastinum   Carotid atherosclerosis    On aspirin and high dose statin; reviewed last carotid scan; ordered US      Relevant Medications   atorvastatin (LIPITOR) 80 MG tablet   lisinopril (PRINIVIL,ZESTRIL) 10 MG tablet   Other  Relevant Orders   US Carotid Duplex Bilateral   Lipid Panel w/o Chol/HDL Ratio   Essential hypertension, benign    Well-controlled; continue ACE-I; follow DASH guidelines      Relevant Medications   atorvastatin (LIPITOR) 80 MG tablet   lisinopril (PRINIVIL,ZESTRIL) 10 MG tablet     Digestive   Vitamin B12 deficiency    500 or 1000 mcg B12 daily        Other   Breast cancer screening    Order mammo  Relevant Orders   MM DIGITAL SCREENING BILATERAL   Hyperlipidemia - Primary    Limit saturated fats, continue statin      Relevant Medications   atorvastatin (LIPITOR) 80 MG tablet   lisinopril (PRINIVIL,ZESTRIL) 10 MG tablet   Medication monitoring encounter   Relevant Orders   Comprehensive metabolic panel   Postmenopausal    DEXA scan      Relevant Orders   DG Bone Density   Vitamin D deficiency disease    Check vit D level       Other Visit Diagnoses    Weight gain        Relevant Orders    TSH    Need for hepatitis C screening test        Relevant Orders    Hepatitis C antibody       Follow up plan: Return in about 6 months (around 01/30/2016) for fasting labs and visit with Dr. Sanda Klein.  An after-visit summary was printed and given to the patient at [facility].  Please see the patient instructions which may contain other information and recommendations beyond what is mentioned above in the assessment and plan.  Meds ordered this encounter  Medications  . atorvastatin (LIPITOR) 80 MG tablet    Sig: Take 1 tablet (80 mg total) by mouth daily.    Dispense:  90 tablet    Refill:  3  . lisinopril (PRINIVIL,ZESTRIL) 10 MG tablet    Sig: Take 1 tablet (10 mg total) by mouth daily. For blood pressure    Dispense:  90 tablet    Refill:  3    Orders Placed This Encounter  Procedures  . US Carotid Duplex Bilateral  . DG Bone Density  . MM DIGITAL SCREENING BILATERAL  . Lipid Panel w/o Chol/HDL Ratio  . Hepatitis C antibody  . Comprehensive metabolic  panel  . TSH

## 2015-07-30 NOTE — Assessment & Plan Note (Signed)
500 or 1000 mcg B12 daily

## 2015-07-30 NOTE — Assessment & Plan Note (Signed)
Limit saturated fats, continue statin 

## 2015-07-30 NOTE — Assessment & Plan Note (Addendum)
On aspirin and high dose statin; reviewed last carotid scan; ordered US

## 2015-07-30 NOTE — Patient Instructions (Signed)
Try to limit saturated fats in your diet (bologna, hot dogs, barbeque, cheeseburgers, hamburgers, steak, bacon, sausage, cheese, etc.) and get more fresh fruits, vegetables, and whole grains Please do call to schedule your mammogram and bone density test; the number to schedule one at either [facility] or [facility] is [phone] Have fasting labs done at your convenience We'll order your carotid scan Do take 81 mg coated aspirin every day You can take your cholesterol medicine and blood pressure medicine in the morning If you have not heard anything from my staff in a week about any orders/referrals/studies from today, please contact us here to follow-up [phone] Have a lovely time at the beach :-)

## 2015-07-30 NOTE — Assessment & Plan Note (Signed)
Check vit D level. 

## 2015-08-10 LAB — TSH: TSH: 2.67 u[IU]/mL (ref 0.450–4.500)

## 2015-08-10 LAB — COMPREHENSIVE METABOLIC PANEL
ALBUMIN: 3.9 g/dL (ref 3.6–4.8)
ALK PHOS: 84 IU/L (ref 39–117)
ALT: 16 IU/L (ref 0–32)
AST: 19 IU/L (ref 0–40)
Albumin/Globulin Ratio: 1.6 (ref 1.2–2.2)
BUN / CREAT RATIO: 23 (ref 12–28)
BUN: 20 mg/dL (ref 8–27)
Bilirubin Total: 0.4 mg/dL (ref 0.0–1.2)
CALCIUM: 9 mg/dL (ref 8.7–10.3)
CHLORIDE: 104 mmol/L (ref 96–106)
CO2: 25 mmol/L (ref 18–29)
CREATININE: 0.86 mg/dL (ref 0.57–1.00)
GFR, EST AFRICAN AMERICAN: 81 mL/min/{1.73_m2} (ref >59)
GFR, EST NON AFRICAN AMERICAN: 71 mL/min/{1.73_m2} (ref >59)
GLOBULIN, TOTAL: 2.4 g/dL (ref 1.5–4.5)
GLUCOSE: 85 mg/dL (ref 65–99)
POTASSIUM: 4.5 mmol/L (ref 3.5–5.2)
Sodium: 142 mmol/L (ref 134–144)
TOTAL PROTEIN: 6.3 g/dL (ref 6.0–8.5)

## 2015-08-10 LAB — LIPID PANEL W/O CHOL/HDL RATIO
Cholesterol, Total: 175 mg/dL (ref 100–199)
HDL: 54 mg/dL (ref >39)
LDL Calculated: 99 mg/dL (ref 0–99)
Triglycerides: 110 mg/dL (ref 0–149)
VLDL Cholesterol Cal: 22 mg/dL (ref 5–40)

## 2015-09-09 ENCOUNTER — Ambulatory Visit (INDEPENDENT_AMBULATORY_CARE_PROVIDER_SITE_OTHER): Payer: Medicare Other | Admitting: Family Medicine

## 2015-09-09 ENCOUNTER — Encounter: Payer: Self-pay | Admitting: Family Medicine

## 2015-09-09 ENCOUNTER — Encounter (INDEPENDENT_AMBULATORY_CARE_PROVIDER_SITE_OTHER): Payer: Self-pay

## 2015-09-09 DIAGNOSIS — I6523 Occlusion and stenosis of bilateral carotid arteries: Secondary | ICD-10-CM | POA: Diagnosis not present

## 2015-09-09 DIAGNOSIS — S8391XA Sprain of unspecified site of right knee, initial encounter: Secondary | ICD-10-CM

## 2015-09-09 MED ORDER — HYDROCODONE-ACETAMINOPHEN 5-325 MG PO TABS: 1.0000 | ORAL_TABLET | ORAL | Status: DC | PRN

## 2015-09-09 NOTE — Patient Instructions (Addendum)
Continue to take your aspirin Do foot exercises to prevent a blood clot in the leg I do suspect that you have strained a ligament, so if not getting better after 2 weeks, call me and we'll refer to orthopaedist Practice good fall precautions If it locks, or worsens, then do call right away Please call us if you have not heard about your scan by Monday Use pain medicine as directed if needed Total daily tylenol (acetaminophen) should not exceed 3,000 mg per day Okay to take ibuprofen or aleve in addition if needed, but take your aspirin first thing in the morning and don't take any ibuprofen or aleve for at least one hour

## 2015-09-09 NOTE — Progress Notes (Signed)
BP 128/70   Pulse 99   Temp 98.7 F (37.1 C) (Oral)   Resp 16   Ht 4\' 11"  (1.499 m)   Wt 146 lb 9.6 oz (66.5 kg)   SpO2 95%   BMI 29.61 kg/m    Subjective:    Patient ID: Sarah Gillespie, female    DOB: 1950-12-08, 65 y.o.   MRN: [id number]  HPI: Sarah Gillespie is a 65 y.o. female  Chief Complaint  Patient presents with  . Knee Pain    follow up urgent care, right knee injury   She hurt her right knee over the weekend Went to [facility] on Monday Injury happened on Saturday; twisted the right leg while emptying groceries from cart to go to car Felt it immediately; no popping sound, no buckling Not much swelling, just a little noted by the provider Trying to keep it elevated Using a velcro brace on the knee They gave her some pain medicine Pain is a 6 out of 10 right now Only has four pain pills left  She has not heard anyting about her carotid scan; ordered in May  PHQ-2 score is zero  Relevant past medical, surgical, social history reviewed Past Medical History:  Diagnosis Date  . Angular cheilitis   . Anxiety   . Carotid atherosclerosis   . Carotid bruit   . Depression   . Elevated blood pressure   . Frequency   . Gross hematuria   . Hyperlipidemia   . Nocturia   . Pyuria   . Sigmoid diverticulosis Oct. 2015   CT scan  . Vitamin B12 deficiency   . Vitamin D deficiency disease    Past Surgical History:  Procedure Laterality Date  . ABDOMINAL HYSTERECTOMY  1987  . APPENDECTOMY  1960  . CARPAL TUNNEL RELEASE Left 1988  . EXCISION NEUROMA Left 1988  . TONSILLECTOMY  1962   Social History  Substance Use Topics  . Smoking status: Never Smoker  . Smokeless tobacco: Never Used  . Alcohol use Yes     Comment: occasional wine   Interim medical history since last visit reviewed. Allergies and medications reviewed  Review of Systems Per HPI unless specifically indicated above     Objective:    BP 128/70   Pulse 99   Temp 98.7 F (37.1 C)  (Oral)   Resp 16   Ht 4\' 11"  (1.499 m)   Wt 146 lb 9.6 oz (66.5 kg)   SpO2 95%   BMI 29.61 kg/m     Physical Exam  Constitutional: She appears well-developed and well-nourished.  Cardiovascular: Normal rate.   Pulmonary/Chest: Effort normal.  Musculoskeletal:       Right knee: She exhibits decreased range of motion and swelling. She exhibits no ecchymosis, no deformity, no erythema, no LCL laxity and no MCL laxity. Tenderness found.  Psychiatric: She has a normal mood and affect.      Assessment & Plan:   Problem List Items Addressed This Visit      Cardiovascular and Mediastinum   Carotid atherosclerosis    Will ask staff to check on this order from previous visit; if she needs to use NSAID for pain relief for her knee, I advised her to take her aspirin at least one hour prior to the NSAID for full anti-platelet effects (need the aspirin to irreversibly acetylate the cyclo-oxygenase before the NSAIDs hits thromboxane A2)        Musculoskeletal and Integument   Knee sprain and strain  Suspect strain; do not think tear; will treat conservatively; see after visit summary for advice; patient to call in a few weeks if not better; advised calf exercises to help lessen chance of blood clot       Other Visit Diagnoses   None.     Follow up plan: Return if symptoms worsen or fail to improve.  An after-visit summary was printed and given to the patient at [facility].  Please see the patient instructions which may contain other information and recommendations beyond what is mentioned above in the assessment and plan.  Meds ordered this encounter  Medications  . DISCONTD: HYDROcodone-acetaminophen (NORCO/VICODIN) 5-325 MG tablet  . DISCONTD: HYDROcodone-acetaminophen (NORCO/VICODIN) 5-325 MG tablet    Sig: Take 1 tablet by mouth every 4 (four) hours as needed for moderate pain.    Dispense:  20 tablet    Refill:  0    No orders of the defined types were placed in this  encounter.

## 2015-09-16 ENCOUNTER — Ambulatory Visit
Admission: RE | Admit: 2015-09-16 | Discharge: 2015-09-16 | Disposition: A | Discharge status: Home / Self Care | Payer: Medicare Other | Source: Ambulatory Visit | Attending: Family Medicine | Admitting: Family Medicine

## 2015-09-16 DIAGNOSIS — I6523 Occlusion and stenosis of bilateral carotid arteries: Secondary | ICD-10-CM | POA: Diagnosis present

## 2015-10-08 ENCOUNTER — Encounter: Payer: Self-pay | Admitting: Family Medicine

## 2015-10-08 ENCOUNTER — Ambulatory Visit (INDEPENDENT_AMBULATORY_CARE_PROVIDER_SITE_OTHER): Payer: Medicare Other | Admitting: Family Medicine

## 2015-10-08 DIAGNOSIS — F32A Depression, unspecified: Secondary | ICD-10-CM

## 2015-10-08 DIAGNOSIS — M5430 Sciatica, unspecified side: Secondary | ICD-10-CM | POA: Insufficient documentation

## 2015-10-08 DIAGNOSIS — F329 Major depressive disorder, single episode, unspecified: Secondary | ICD-10-CM

## 2015-10-08 DIAGNOSIS — M5431 Sciatica, right side: Secondary | ICD-10-CM | POA: Diagnosis not present

## 2015-10-08 MED ORDER — VENLAFAXINE HCL ER 37.5 MG PO CP24: 37.5000 mg | ORAL_CAPSULE | Freq: Every day | ORAL | 0 refills | Status: DC

## 2015-10-08 MED ORDER — MELOXICAM 7.5 MG PO TABS: 7.5000 mg | ORAL_TABLET | Freq: Every day | ORAL | 0 refills | Status: DC | PRN

## 2015-10-08 NOTE — Progress Notes (Signed)
BP 132/78   Pulse 100   Temp 98.1 F (36.7 C) (Oral)   Resp 14   Wt 144 lb (65.3 kg)   SpO2 95%   BMI 29.08 kg/m    Subjective:    Patient ID: Sarah Gillespie, female    DOB: February 24, 1951, 65 y.o.   MRN: [id number]  HPI: Kathiria Parthasarathy is a 65 y.o. female  Chief Complaint  Patient presents with  . Depression   Patient is here for evaluation of depression; long hx; she decided to take herself off of her medicines months ago and was doing well for a while Started to get depressed 2 weeks ago; tearfulness, no irritability Appetite is the same Does dwell on things; mind will race at night, thoughts are going from one to the other Hard time falling asleep at night; trouble staying asleep; naps in the afternoon No mania or hypomania though when we reviewed symptoms She has been on multiple meds; even had ECT ("shock treatments"); some outpatient, some inpatient; previous suicide attempt with overdose, no thoughts now of hurting herself or anyone else  She also has sciatica; juts down the right leg; worse at night; a little back pain; no B/B dysfunction; she has not done PT; no weakness in the right leg  Depression screen [facility] 2/9 10/08/2015 09/09/2015 07/30/2015 12/16/2014 12/03/2014  Decreased Interest 1 0 0 0 0  Down, Depressed, Hopeless 3 0 0 0 0  PHQ - 2 Score 4 0 0 0 0  Altered sleeping 3 - - - -  Tired, decreased energy 3 - - - -  Change in appetite 0 - - - -  Feeling bad or failure about yourself  0 - - - -  Trouble concentrating 1 - - - -  Moving slowly or fidgety/restless 0 - - - -  Suicidal thoughts 0 - - - -  PHQ-9 Score 11 - - - -  Difficult doing work/chores Somewhat difficult - - - -   Not really any anxiety; GAD-7 not done  Relevant past medical, surgical, family and social history reviewed Past Medical History:  Diagnosis Date  . Angular cheilitis   . Anxiety   . Carotid atherosclerosis   . Carotid bruit   . Depression   . Elevated blood pressure   .  Frequency   . Gross hematuria   . Hyperlipidemia   . Nocturia   . Pyuria   . Sigmoid diverticulosis Oct. 2015   CT scan  . Vitamin B12 deficiency   . Vitamin D deficiency disease    Family History  Problem Relation Age of Onset  . Hypertension Mother   . Heart disease Mother   . Clotting disorder Mother   . Other Sister     muscle disease  . Cancer Brother 50    lung, brain cancer  . ALS Father   . Urolithiasis Father   . Thyroid disease Father   . Diabetes Maternal Grandmother   . Stroke Brother   . Thyroid disease Sister   . Clotting disorder Sister    Social History  Substance Use Topics  . Smoking status: Never Smoker  . Smokeless tobacco: Never Used  . Alcohol use Yes     Comment: occasional wine   Interim medical history since last visit reviewed. Allergies and medications reviewed  Review of Systems Per HPI unless specifically indicated above     Objective:    BP 132/78   Pulse 100   Temp 98.1 F (36.7  C) (Oral)   Resp 14   Wt 144 lb (65.3 kg)   SpO2 95%   BMI 29.08 kg/m   Wt Readings from Last 3 Encounters:  10/08/15 144 lb (65.3 kg)  09/09/15 146 lb 9.6 oz (66.5 kg)  07/30/15 145 lb (65.8 kg)    Physical Exam  Constitutional: She appears well-developed and well-nourished. No distress.  Eyes: EOM are normal. No scleral icterus.  Neck: No thyromegaly present.  Cardiovascular: Normal rate.   Pulmonary/Chest: Effort normal.  Abdominal: She exhibits no distension.  Skin: No pallor.  Psychiatric: Her behavior is normal. Judgment and thought content normal. Her mood appears not anxious. Her affect is blunt. Her affect is not labile and not inappropriate. Her speech is not delayed, not tangential and not slurred. She is not agitated, not aggressive, not slowed and not withdrawn. Cognition and memory are not impaired. She does not express impulsivity or inappropriate judgment. She exhibits a depressed mood (mildly depressed, not tearful, not  despondent). She expresses no homicidal and no suicidal ideation.  Good eye contact with examiner     Results for orders placed or performed in visit on 07/30/15  Lipid Panel w/o Chol/HDL Ratio  Result Value Ref Range   Cholesterol, Total 175 100 - 199 mg/dL   Triglycerides 110 0 - 149 mg/dL   HDL 54 >39 mg/dL   VLDL Cholesterol Cal 22 5 - 40 mg/dL   LDL Calculated 99 0 - 99 mg/dL  Comprehensive metabolic panel  Result Value Ref Range   Glucose 85 65 - 99 mg/dL   BUN 20 8 - 27 mg/dL   Creatinine, Ser 0.86 0.57 - 1.00 mg/dL   GFR calc non Af Amer 71 >59 mL/min/1.73   GFR calc Af Amer 81 >59 mL/min/1.73   BUN/Creatinine Ratio 23 12 - 28   Sodium 142 134 - 144 mmol/L   Potassium 4.5 3.5 - 5.2 mmol/L   Chloride 104 96 - 106 mmol/L   CO2 25 18 - 29 mmol/L   Calcium 9.0 8.7 - 10.3 mg/dL   Total Protein 6.3 6.0 - 8.5 g/dL   Albumin 3.9 3.6 - 4.8 g/dL   Globulin, Total 2.4 1.5 - 4.5 g/dL   Albumin/Globulin Ratio 1.6 1.2 - 2.2   Bilirubin Total 0.4 0.0 - 1.2 mg/dL   Alkaline Phosphatase 84 39 - 117 IU/L   AST 19 0 - 40 IU/L   ALT 16 0 - 32 IU/L  TSH  Result Value Ref Range   TSH 2.670 0.450 - 4.500 uIU/mL      Assessment & Plan:   Problem List Items Addressed This Visit      Nervous and Auditory   Sciatica    Refer to PT; try turmeric; Rx for anti-inflammatory short-term      Relevant Medications   meloxicam (MOBIC) 7.5 MG tablet   venlafaxine XR (EFFEXOR XR) 37.5 MG 24 hr capsule   Other Relevant Orders   Ambulatory referral to Physical Therapy     Other   Depression    Tried multiple meds, and we'll start back on effexor; verbal contract for safety      Relevant Medications   venlafaxine XR (EFFEXOR XR) 37.5 MG 24 hr capsule    Other Visit Diagnoses   None.     Follow up plan: Return in about 2 weeks (around 10/21/2015) for follow-up of meds.  An after-visit summary was printed and given to the patient at [place].  Please see the patient instructions  which may contain other information and recommendations beyond what is mentioned above in the assessment and plan.  Meds ordered this encounter  Medications  . meloxicam (MOBIC) 7.5 MG tablet    Sig: Take 1 tablet (7.5 mg total) by mouth daily as needed for pain.    Dispense:  30 tablet    Refill:  0  . venlafaxine XR (EFFEXOR XR) 37.5 MG 24 hr capsule    Sig: Take 1 capsule (37.5 mg total) by mouth daily with breakfast. For four days, then two a day    Dispense:  60 capsule    Refill:  0    Orders Placed This Encounter  Procedures  . Ambulatory referral to Physical Therapy

## 2015-10-08 NOTE — Assessment & Plan Note (Addendum)
Refer to PT; try turmeric; Rx for anti-inflammatory short-term

## 2015-10-08 NOTE — Patient Instructions (Addendum)
Check out [facility]  [place] [phone]  Start the new medicines If you have any dark thoughts, then please seek help

## 2015-10-08 NOTE — Assessment & Plan Note (Signed)
Tried multiple meds, and we'll start back on effexor; verbal contract for safety

## 2015-10-18 ENCOUNTER — Telehealth: Payer: Self-pay | Admitting: Family Medicine

## 2015-10-18 NOTE — Telephone Encounter (Signed)
Erroneous encounter

## 2015-10-20 ENCOUNTER — Telehealth: Payer: Self-pay | Admitting: Family Medicine

## 2015-10-21 NOTE — Telephone Encounter (Signed)
I called patient back and Dr. Ancil Boozer had already left early for the day so I could not ask her.  But the patient states having trouble with the Effexor since upping the dose to bid has been experiencing symptoms of weakness in legs(states it feels like jello) so she is going to back down on it to qd until you return.

## 2015-10-25 NOTE — Telephone Encounter (Signed)
I reviewed note from when I was away on vacation Left message, returning her call If any dark thoughts, seek help immediately We'll talk soon and figure out what to do with her medicine

## 2015-10-28 ENCOUNTER — Ambulatory Visit: Payer: Medicare Other | Admitting: Family Medicine

## 2015-10-29 MED ORDER — CELECOXIB 100 MG PO CAPS: 100.0000 mg | ORAL_CAPSULE | Freq: Two times a day (BID) | ORAL | 2 refills | Status: DC

## 2015-10-29 MED ORDER — VENLAFAXINE HCL ER 37.5 MG PO CP24: 37.5000 mg | ORAL_CAPSULE | Freq: Every day | ORAL | 5 refills | Status: DC

## 2015-10-29 NOTE — Addendum Note (Signed)
Addended by: LADA, Satira Anis on: 10/29/2015 05:44 PM   Modules accepted: Orders

## 2015-10-29 NOTE — Telephone Encounter (Signed)
I talked with patient; she said to go back to just one pill of effexor; had been shaky and nauseated; no energy; legs felt like rubber; dehydrated; started hydrating better She is doing better on the one pill now Anti-inflammatory made her sick so she stopped; she has failed meloxicam, naproxen, and ibuprofen; will try celebrex; may need prior auth She is feeling better over all

## 2015-11-04 ENCOUNTER — Ambulatory Visit (INDEPENDENT_AMBULATORY_CARE_PROVIDER_SITE_OTHER): Payer: Medicare Other | Admitting: Family Medicine

## 2015-11-04 VITALS — BP 118/74 | HR 82 | Temp 98.3°F | Resp 14 | Wt 143.0 lb

## 2015-11-04 DIAGNOSIS — M7062 Trochanteric bursitis, left hip: Secondary | ICD-10-CM | POA: Diagnosis not present

## 2015-11-04 DIAGNOSIS — M7061 Trochanteric bursitis, right hip: Secondary | ICD-10-CM

## 2015-11-04 DIAGNOSIS — F329 Major depressive disorder, single episode, unspecified: Secondary | ICD-10-CM | POA: Diagnosis not present

## 2015-11-04 DIAGNOSIS — Z23 Encounter for immunization: Secondary | ICD-10-CM

## 2015-11-04 DIAGNOSIS — F32A Depression, unspecified: Secondary | ICD-10-CM

## 2015-11-04 NOTE — Patient Instructions (Addendum)
You received the flu shot today; it should protect you against the flu virus over the coming months; it will take about two weeks for antibodies to develop; do try to stay away from hospitals, nursing homes, and daycares during peak flu season; taking extra vitamin C daily during flu season may help you avoid getting sick  Try over-the-counter hydrocortisone on the spots on your arm and neck  Monitor your symptoms, especially through shorter days, and call if you'd like that dose increased back up Do get 20-30 minutes of sunlight a day, especially through winter

## 2015-11-04 NOTE — Progress Notes (Signed)
BP 118/74   Pulse 82   Temp 98.3 F (36.8 C) (Oral)   Resp 14   Wt 143 lb (64.9 kg)   SpO2 95%   BMI 28.88 kg/m    Subjective:    Patient ID: Sarah Gillespie, female    DOB: May 16, 1950, 65 y.o.   MRN: [id number]  HPI: Sarah Gillespie is a 65 y.o. female  Chief Complaint  Patient presents with  . Follow-up    2 weeks   She is on the SNRI; reacted to the increase of the SNRI, might have been too rapid of a jump up; just felt lousy, ached all over, wanted to sleep constantly; no appetite, just wiped out; no thoughts of self-harm or hurting others; motivation to shower and cook; not isolating  BP well-controlled  On celebrex, got approved and taking; not having any stomach upset, aches and pains improved  She agrees to flu shot today  She has some dry spots on her skin  Depression screen [facility] 2/9 11/04/2015 10/08/2015 09/09/2015 07/30/2015 12/16/2014  Decreased Interest 0 1 0 0 0  Down, Depressed, Hopeless 1 3 0 0 0  PHQ - 2 Score 1 4 0 0 0  Altered sleeping - 3 - - -  Tired, decreased energy - 3 - - -  Change in appetite - 0 - - -  Feeling bad or failure about yourself  - 0 - - -  Trouble concentrating - 1 - - -  Moving slowly or fidgety/restless - 0 - - -  Suicidal thoughts - 0 - - -  PHQ-9 Score - 11 - - -  Difficult doing work/chores - Somewhat difficult - - -   Relevant past medical, surgical, family and social history reviewed Past Medical History:  Diagnosis Date  . Angular cheilitis   . Anxiety   . Carotid atherosclerosis   . Carotid bruit   . Depression   . Elevated blood pressure   . Frequency   . Gross hematuria   . Hyperlipidemia   . Nocturia   . Pyuria   . Sigmoid diverticulosis Oct. 2015   CT scan  . Vitamin B12 deficiency   . Vitamin D deficiency disease    Social History  Substance Use Topics  . Smoking status: Never Smoker  . Smokeless tobacco: Never Used  . Alcohol use Yes     Comment: occasional wine   Interim medical history since  last visit reviewed. Allergies and medications reviewed  Review of Systems Per HPI unless specifically indicated above     Objective:    BP 118/74   Pulse 82   Temp 98.3 F (36.8 C) (Oral)   Resp 14   Wt 143 lb (64.9 kg)   SpO2 95%   BMI 28.88 kg/m   Wt Readings from Last 3 Encounters:  11/04/15 143 lb (64.9 kg)  10/08/15 144 lb (65.3 kg)  09/09/15 146 lb 9.6 oz (66.5 kg)    Physical Exam  Constitutional: She appears well-developed and well-nourished. No distress.  Eyes: EOM are normal.  Neck: No thyromegaly present.  Cardiovascular: Normal rate and regular rhythm.   Pulmonary/Chest: Effort normal and breath sounds normal.  Abdominal: She exhibits no distension.  Skin:  Few spots of what look like eczematous dermatitis on neck, arms  Psychiatric: She has a normal mood and affect. Her behavior is normal. Judgment and thought content normal.  Good eye contact with examiner      Assessment & Plan:   Problem  List Items Addressed This Visit      Musculoskeletal and Integument   Trochanteric bursitis of both hips    Doing well with Celebrex        Other   Depression - Primary    Discussed treatment options; will continue current therapy; can increase in the future if she feels needed, especially as days shorten; sunlight important, getting outdoors; call if needed       Other Visit Diagnoses    Needs flu shot       Relevant Orders   Flu vaccine HIGH DOSE PF (Fluzone High dose) (Completed)      Follow up plan: Return in about 3 months (around 02/03/2016) for follow-up, but I'm here sooner if needed.  An after-visit summary was printed and given to the patient at [place].  Please see the patient instructions which may contain other information and recommendations beyond what is mentioned above in the assessment and plan.  No orders of the defined types were placed in this encounter.   Orders Placed This Encounter  Procedures  . Flu vaccine HIGH DOSE PF  (Fluzone High dose)

## 2015-11-08 DIAGNOSIS — IMO0002 Reserved for concepts with insufficient information to code with codable children: Secondary | ICD-10-CM | POA: Insufficient documentation

## 2015-11-08 NOTE — Assessment & Plan Note (Signed)
Discussed treatment options; will continue current therapy; can increase in the future if she feels needed, especially as days shorten; sunlight important, getting outdoors; call if needed

## 2015-11-08 NOTE — Assessment & Plan Note (Signed)
Suspect strain; do not think tear; will treat conservatively; see after visit summary for advice; patient to call in a few weeks if not better; advised calf exercises to help lessen chance of blood clot

## 2015-11-08 NOTE — Assessment & Plan Note (Addendum)
Will ask staff to check on this order from previous visit; if she needs to use NSAID for pain relief for her knee, I advised her to take her aspirin at least one hour prior to the NSAID for full anti-platelet effects (need the aspirin to irreversibly acetylate the cyclo-oxygenase before the NSAIDs hits thromboxane A2)

## 2015-11-08 NOTE — Assessment & Plan Note (Signed)
Doing well with Celebrex

## 2016-01-31 ENCOUNTER — Ambulatory Visit (INDEPENDENT_AMBULATORY_CARE_PROVIDER_SITE_OTHER): Payer: Medicare Other | Admitting: Family Medicine

## 2016-01-31 ENCOUNTER — Encounter: Payer: Self-pay | Admitting: Family Medicine

## 2016-01-31 DIAGNOSIS — E782 Mixed hyperlipidemia: Secondary | ICD-10-CM

## 2016-01-31 DIAGNOSIS — F3341 Major depressive disorder, recurrent, in partial remission: Secondary | ICD-10-CM | POA: Diagnosis not present

## 2016-01-31 DIAGNOSIS — M5431 Sciatica, right side: Secondary | ICD-10-CM

## 2016-01-31 DIAGNOSIS — Z5181 Encounter for therapeutic drug level monitoring: Secondary | ICD-10-CM | POA: Diagnosis not present

## 2016-01-31 DIAGNOSIS — I10 Essential (primary) hypertension: Secondary | ICD-10-CM | POA: Diagnosis not present

## 2016-01-31 DIAGNOSIS — I6521 Occlusion and stenosis of right carotid artery: Secondary | ICD-10-CM | POA: Diagnosis not present

## 2016-01-31 LAB — COMPLETE METABOLIC PANEL WITH GFR
ALT: 10 U/L (ref 6–29)
AST: 17 U/L (ref 10–35)
Albumin: 4.1 g/dL (ref 3.6–5.1)
Alkaline Phosphatase: 94 U/L (ref 33–130)
BUN: 20 mg/dL (ref 7–25)
CALCIUM: 9.4 mg/dL (ref 8.6–10.4)
CHLORIDE: 105 mmol/L (ref 98–110)
CO2: 26 mmol/L (ref 20–31)
Creat: 1.09 mg/dL — ABNORMAL HIGH (ref 0.50–0.99)
GFR, Est African American: 61 mL/min (ref >=60)
GFR, Est Non African American: 53 mL/min — ABNORMAL LOW (ref >=60)
Glucose, Bld: 88 mg/dL (ref 65–99)
Potassium: 4.4 mmol/L (ref 3.5–5.3)
Sodium: 139 mmol/L (ref 135–146)
Total Bilirubin: 0.5 mg/dL (ref 0.2–1.2)
Total Protein: 6.7 g/dL (ref 6.1–8.1)

## 2016-01-31 LAB — CBC WITH DIFFERENTIAL/PLATELET
Basophils Absolute: 41 cells/uL (ref 0–200)
Basophils Relative: 1 %
EOS PCT: 2 %
Eosinophils Absolute: 82 cells/uL (ref 15–500)
HEMATOCRIT: 42.9 % (ref 35.0–45.0)
Hemoglobin: 14 g/dL (ref 11.7–15.5)
Lymphocytes Relative: 25 %
Lymphs Abs: 1025 cells/uL (ref 850–3900)
MCH: 30.2 pg (ref 27.0–33.0)
MCHC: 32.6 g/dL (ref 32.0–36.0)
MCV: 92.7 fL (ref 80.0–100.0)
MONO ABS: 451 {cells}/uL (ref 200–950)
MPV: 9.8 fL (ref 7.5–12.5)
Monocytes Relative: 11 %
NEUTROS PCT: 61 %
Neutro Abs: 2501 cells/uL (ref 1500–7800)
Platelets: 378 10*3/uL (ref 140–400)
RBC: 4.63 MIL/uL (ref 3.80–5.10)
RDW: 14.7 % (ref 11.0–15.0)
WBC: 4.1 10*3/uL (ref 3.8–10.8)

## 2016-01-31 LAB — LIPID PANEL
CHOL/HDL RATIO: 4.7 ratio (ref <5.0)
Cholesterol: 268 mg/dL — ABNORMAL HIGH (ref <200)
HDL: 57 mg/dL (ref >50)
LDL CALC: 188 mg/dL — AB (ref <100)
Triglycerides: 116 mg/dL (ref <150)
VLDL: 23 mg/dL (ref <30)

## 2016-01-31 NOTE — Progress Notes (Signed)
BP 124/78   Pulse 86   Temp 97.8 F (36.6 C) (Oral)   Resp 14   Wt 147 lb (66.7 kg)   SpO2 97%   BMI 29.69 kg/m    Subjective:    Patient ID: Sarah Gillespie, female    DOB: [birth year], 65 y.o.   MRN: [id number]  HPI: Sarah Gillespie is a 65 y.o. female  Chief Complaint  Patient presents with  . Follow-up   High cholesterol; trying to avoid fatty foods; maybe just one egg a week; on statin, atorvastatin 80 mg nightly; no problems; sisters have high cholesterol, mother might have had high cholesterol too but she's not sure  Carotid atherosclerosis; last imaging was July 2017; no sx of amaurosis fugax or TIA symptoms; on statin and aspirin  -------------------------------- Carotid US IMPRESSION: No hemodynamically significant stenosis or plaque is noted in the left internal carotid artery.  Mild eccentric calcified plaque is noted in the right carotid bulb and proximal right internal carotid artery consistent with less than 50% diameter stenosis based on ultrasound criteria. Measured Doppler velocities are high, but this is felt most likely to be due to artifact.  These findings are not significantly changed compared to prior exam.   Electronically Signed   By: Marijo Conception, M.D.   On: 09/16/2015 14:50 --------------------------------------  Goes to the dentist appt for extraction; she broke another one  Depression; "doing pretty good"; sometimes holidays can be rough; keeps busy and does crafts for therapy  Hypertension; well-controlled  She has been having pain in the right breast right behind the boob; she feels it with certain movements; no SHOB; no lumps in the breast; last breast exam normal; she did not get the mammogram; she will do the mammogram and opted for no other imaging; no cough; very right-handed  Depression screen [facility] 2/9 01/31/2016 11/04/2015 10/08/2015 09/09/2015 07/30/2015  Decreased Interest 0 0 1 0 0  Down, Depressed, Hopeless 0 1 3 0 0    PHQ - 2 Score 0 1 4 0 0  Altered sleeping - - 3 - -  Tired, decreased energy - - 3 - -  Change in appetite - - 0 - -  Feeling bad or failure about yourself  - - 0 - -  Trouble concentrating - - 1 - -  Moving slowly or fidgety/restless - - 0 - -  Suicidal thoughts - - 0 - -  PHQ-9 Score - - 11 - -  Difficult doing work/chores - - Somewhat difficult - -   Relevant past medical, surgical, family and social history reviewed Past Medical History:  Diagnosis Date  . Angular cheilitis   . Anxiety   . Carotid atherosclerosis   . Carotid bruit   . Depression   . Elevated blood pressure   . Frequency   . Gross hematuria   . Hyperlipidemia   . Nocturia   . Pyuria   . Sigmoid diverticulosis Oct. 2015   CT scan  . Vitamin B12 deficiency   . Vitamin D deficiency disease    Past Surgical History:  Procedure Laterality Date  . ABDOMINAL HYSTERECTOMY  1987  . APPENDECTOMY  1960  . CARPAL TUNNEL RELEASE Left 1988  . EXCISION NEUROMA Left 1988  . TONSILLECTOMY  1962   Family History  Problem Relation Age of Onset  . Hypertension Mother   . Heart disease Mother   . Clotting disorder Mother   . Other Sister     muscle disease  .  Cancer Brother 50    lung, brain cancer  . ALS Father   . Urolithiasis Father   . Thyroid disease Father   . Diabetes Maternal Grandmother   . Stroke Brother   . Thyroid disease Sister   . Clotting disorder Sister    Social History  Substance Use Topics  . Smoking status: Never Smoker  . Smokeless tobacco: Never Used  . Alcohol use Yes     Comment: occasional wine   Interim medical history since last visit reviewed. Allergies and medications reviewed  Review of Systems Per HPI unless specifically indicated above     Objective:    BP 124/78   Pulse 86   Temp 97.8 F (36.6 C) (Oral)   Resp 14   Wt 147 lb (66.7 kg)   SpO2 97%   BMI 29.69 kg/m   Wt Readings from Last 3 Encounters:  01/31/16 147 lb (66.7 kg)  11/04/15 143 lb (64.9 kg)   10/08/15 144 lb (65.3 kg)    Physical Exam  Constitutional: She appears well-developed and well-nourished. No distress.  Eyes: EOM are normal. No scleral icterus.  Neck: No thyromegaly present.  Cardiovascular: Normal rate and regular rhythm.   Pulmonary/Chest: Effort normal and breath sounds normal.  Tenderness over pec major; no lumps  Abdominal: Soft. Bowel sounds are normal. She exhibits no distension.  Musculoskeletal: She exhibits no edema.  Neurological: She is alert.  Skin: No pallor.  Few spots of what look like eczematous dermatitis on neck, arms  Psychiatric: She has a normal mood and affect. Her behavior is normal. Judgment and thought content normal.  Good eye contact with examiner   Results for orders placed or performed in visit on 07/30/15  Lipid Panel w/o Chol/HDL Ratio  Result Value Ref Range   Cholesterol, Total 175 100 - 199 mg/dL   Triglycerides 110 0 - 149 mg/dL   HDL 54 >39 mg/dL   VLDL Cholesterol Cal 22 5 - 40 mg/dL   LDL Calculated 99 0 - 99 mg/dL  Comprehensive metabolic panel  Result Value Ref Range   Glucose 85 65 - 99 mg/dL   BUN 20 8 - 27 mg/dL   Creatinine, Ser 0.86 0.57 - 1.00 mg/dL   GFR calc non Af Amer 71 >59 mL/min/1.73   GFR calc Af Amer 81 >59 mL/min/1.73   BUN/Creatinine Ratio 23 12 - 28   Sodium 142 134 - 144 mmol/L   Potassium 4.5 3.5 - 5.2 mmol/L   Chloride 104 96 - 106 mmol/L   CO2 25 18 - 29 mmol/L   Calcium 9.0 8.7 - 10.3 mg/dL   Total Protein 6.3 6.0 - 8.5 g/dL   Albumin 3.9 3.6 - 4.8 g/dL   Globulin, Total 2.4 1.5 - 4.5 g/dL   Albumin/Globulin Ratio 1.6 1.2 - 2.2   Bilirubin Total 0.4 0.0 - 1.2 mg/dL   Alkaline Phosphatase 84 39 - 117 IU/L   AST 19 0 - 40 IU/L   ALT 16 0 - 32 IU/L  TSH  Result Value Ref Range   TSH 2.670 0.450 - 4.500 uIU/mL      Assessment & Plan:   Problem List Items Addressed This Visit      Cardiovascular and Mediastinum   Essential hypertension, benign    Well-controlled; try DASH  guidelines      Carotid atherosclerosis    Less than 50% on the right; goal LDL is less than 70; continue statin and aspirin  Nervous and Auditory   Sciatica    Try turmeric        Other   Medication monitoring encounter    Monitor liver function on the statin      Relevant Orders   CBC with Differential/Platelet   COMPLETE METABOLIC PANEL WITH GFR   Hyperlipidemia    Much improved on the statin; continue and check lipids; avoid fatty meats and saturated fats      Relevant Orders   Lipid panel   Depression    Continue medication         Follow up plan: Return in about 6 months (around 07/30/2016) for fasting labs and visit.  An after-visit summary was printed and given to the patient at [place].  Please see the patient instructions which may contain other information and recommendations beyond what is mentioned above in the assessment and plan.  No orders of the defined types were placed in this encounter.   Orders Placed This Encounter  Procedures  . Lipid panel  . CBC with Differential/Platelet  . COMPLETE METABOLIC PANEL WITH GFR

## 2016-01-31 NOTE — Assessment & Plan Note (Signed)
Well-controlled; try DASH guidelines 

## 2016-01-31 NOTE — Assessment & Plan Note (Signed)
Less than 50% on the right; goal LDL is less than 70; continue statin and aspirin

## 2016-01-31 NOTE — Assessment & Plan Note (Signed)
Continue medication.

## 2016-01-31 NOTE — Assessment & Plan Note (Signed)
Monitor liver function on the statin

## 2016-01-31 NOTE — Patient Instructions (Signed)
Try to limit saturated fats in your diet (bologna, hot dogs, barbeque, cheeseburgers, hamburgers, steak, bacon, sausage, cheese, etc.) and get more fresh fruits, vegetables, and whole grains Your goal blood pressure is less than 150 mmHg on top. Try to follow the DASH guidelines (DASH stands for Dietary Approaches to Stop Hypertension) Try to limit the sodium in your diet.  Ideally, consume less than 1.5 grams (less than 1,500mg ) per day. Do not add salt when cooking or at the table.  Check the sodium amount on labels when shopping, and choose items lower in sodium when given a choice. Avoid or limit foods that already contain a lot of sodium. Eat a diet rich in fruits and vegetables and whole grains. You'll be due for your next carotid scan in July 2018

## 2016-01-31 NOTE — Assessment & Plan Note (Signed)
Much improved on the statin; continue and check lipids; avoid fatty meats and saturated fats

## 2016-01-31 NOTE — Assessment & Plan Note (Signed)
Try turmeric 

## 2016-08-01 ENCOUNTER — Ambulatory Visit (INDEPENDENT_AMBULATORY_CARE_PROVIDER_SITE_OTHER): Payer: Medicare HMO | Admitting: Family Medicine

## 2016-08-01 ENCOUNTER — Encounter: Payer: Self-pay | Admitting: Family Medicine

## 2016-08-01 VITALS — BP 126/74 | HR 94 | Temp 98.2°F | Resp 14 | Ht 59.0 in | Wt 146.7 lb

## 2016-08-01 DIAGNOSIS — F3341 Major depressive disorder, recurrent, in partial remission: Secondary | ICD-10-CM | POA: Diagnosis not present

## 2016-08-01 DIAGNOSIS — Z13820 Encounter for screening for osteoporosis: Secondary | ICD-10-CM | POA: Diagnosis not present

## 2016-08-01 DIAGNOSIS — E559 Vitamin D deficiency, unspecified: Secondary | ICD-10-CM | POA: Diagnosis not present

## 2016-08-01 DIAGNOSIS — E782 Mixed hyperlipidemia: Secondary | ICD-10-CM

## 2016-08-01 DIAGNOSIS — E538 Deficiency of other specified B group vitamins: Secondary | ICD-10-CM | POA: Diagnosis not present

## 2016-08-01 DIAGNOSIS — K130 Diseases of lips: Secondary | ICD-10-CM

## 2016-08-01 DIAGNOSIS — I10 Essential (primary) hypertension: Secondary | ICD-10-CM

## 2016-08-01 DIAGNOSIS — Z5181 Encounter for therapeutic drug level monitoring: Secondary | ICD-10-CM

## 2016-08-01 DIAGNOSIS — Z1239 Encounter for other screening for malignant neoplasm of breast: Secondary | ICD-10-CM

## 2016-08-01 DIAGNOSIS — Z1231 Encounter for screening mammogram for malignant neoplasm of breast: Secondary | ICD-10-CM | POA: Diagnosis not present

## 2016-08-01 DIAGNOSIS — I6521 Occlusion and stenosis of right carotid artery: Secondary | ICD-10-CM

## 2016-08-01 NOTE — Assessment & Plan Note (Addendum)
Continue statin and aspirin; due for recheck carotid US in July 2018

## 2016-08-01 NOTE — Progress Notes (Signed)
BP 126/74   Pulse 94   Temp 98.2 F (36.8 C) (Oral)   Resp 14   Ht 4' 11"  (1.499 m)   Wt 146 lb 11.2 oz (66.5 kg)   SpO2 97%   BMI 29.63 kg/m    Subjective:    Patient ID: Sarah Gillespie, female    DOB: 1950/03/11, 66 y.o.   MRN: [id number]  HPI: Sarah Gillespie is a 66 y.o. female  Chief Complaint  Patient presents with  . Follow-up    HPI Patient is here for follow-up  HTN; well-controlled; remembering to take her medicines better than before; sometimes adds just a little salt; just for flavor; leg cramps both legs; not with walking  Carotid atherosclerosis; due for recheck September 15, 2016  Last carotid US September 16, 2015 IMPRESSION: No hemodynamically significant stenosis or plaque is noted in the left internal carotid artery.  Mild eccentric calcified plaque is noted in the right carotid bulb and proximal right internal carotid artery consistent with less than 50% diameter stenosis based on ultrasound criteria. Measured Doppler velocities are high, but this is felt most likely to be due to artifact.  These findings are not significantly changed compared to prior exam.  Electronically Signed   By: Marijo Conception, M.D.   On: 09/16/2015 14:50  High cholesterol; tolerating the higher dose of statin; does not eat many fatty meats  Split corners of mouth; dentist said maybe B12 deficiency; some red meat; mostly fish and chicken; hx of low B12, taking multivitamin, nothing just B12  Low vitamin D; energy level is better; taking supplement  She got her full dentures a few weeks ago; still getting used to them; bit the inside of her cheek a little while ago and had to leave them out for 2-3 days; seeing her dentist; goes back soon for recheck  Lab Results  Component Value Date   CHOL 268 (H) 01/31/2016   CHOL 175 08/09/2015   CHOL 266 (H) 12/03/2014   Lab Results  Component Value Date   HDL 57 01/31/2016   HDL 54 08/09/2015   HDL 61 12/03/2014   Lab  Results  Component Value Date   LDLCALC 188 (H) 01/31/2016   LDLCALC 99 08/09/2015   LDLCALC 189 (H) 12/03/2014   Lab Results  Component Value Date   TRIG 116 01/31/2016   TRIG 110 08/09/2015   TRIG 79 12/03/2014   Lab Results  Component Value Date   CHOLHDL 4.7 01/31/2016   No results found for: LDLDIRECT  Depression screen [facility] 2/9 08/01/2016 01/31/2016 11/04/2015 10/08/2015 09/09/2015  Decreased Interest 0 0 0 1 0  Down, Depressed, Hopeless 1 0 1 3 0  PHQ - 2 Score 1 0 1 4 0  Altered sleeping - - - 3 -  Tired, decreased energy - - - 3 -  Change in appetite - - - 0 -  Feeling bad or failure about yourself  - - - 0 -  Trouble concentrating - - - 1 -  Moving slowly or fidgety/restless - - - 0 -  Suicidal thoughts - - - 0 -  PHQ-9 Score - - - 11 -  Difficult doing work/chores - - - Somewhat difficult -   Relevant past medical, surgical, family and social history reviewed Past Medical History:  Diagnosis Date  . Angular cheilitis   . Anxiety   . Carotid atherosclerosis   . Carotid bruit   . Depression   . Elevated blood pressure   .  Frequency   . Gross hematuria   . Hyperlipidemia   . Nocturia   . Pyuria   . Sigmoid diverticulosis Oct. 2015   CT scan  . Vitamin B12 deficiency   . Vitamin D deficiency disease    Past Surgical History:  Procedure Laterality Date  . ABDOMINAL HYSTERECTOMY  1987  . APPENDECTOMY  1960  . CARPAL TUNNEL RELEASE Left 1988  . EXCISION NEUROMA Left 1988  . TONSILLECTOMY  1962   Family History  Problem Relation Age of Onset  . Hypertension Mother   . Heart disease Mother   . Clotting disorder Mother   . Other Sister        muscle disease  . ALS Father   . Urolithiasis Father   . Thyroid disease Father   . Thyroid disease Sister   . Clotting disorder Sister   . Cancer Brother 50       lung, brain cancer  . Diabetes Maternal Grandmother   . Stroke Brother    Social History   Social History  . Marital status: Divorced     Spouse name: N/A  . Number of children: N/A  . Years of education: N/A   Occupational History  . Not on file.   Social History Main Topics  . Smoking status: Never Smoker  . Smokeless tobacco: Never Used  . Alcohol use Yes     Comment: occasional wine  . Drug use: No  . Sexual activity: Not on file   Other Topics Concern  . Not on file   Social History Narrative  . No narrative on file    Interim medical history since last visit reviewed. Allergies and medications reviewed  Review of Systems Per HPI unless specifically indicated above     Objective:    BP 126/74   Pulse 94   Temp 98.2 F (36.8 C) (Oral)   Resp 14   Ht 4' 11"  (1.499 m)   Wt 146 lb 11.2 oz (66.5 kg)   SpO2 97%   BMI 29.63 kg/m   Wt Readings from Last 3 Encounters:  08/01/16 146 lb 11.2 oz (66.5 kg)  01/31/16 147 lb (66.7 kg)  11/04/15 143 lb (64.9 kg)    Physical Exam  Constitutional: She appears well-developed and well-nourished. No distress.  HENT:  Mouth/Throat: Mucous membranes are normal. She has dentures.  Eyes: EOM are normal. No scleral icterus.  Neck: Carotid bruit is not present. No thyromegaly present.  Cardiovascular: Normal rate and regular rhythm.   Pulmonary/Chest: Effort normal and breath sounds normal.  Tenderness over pec major; no lumps  Abdominal: Soft. Bowel sounds are normal. She exhibits no distension.  Musculoskeletal: She exhibits no edema.  Neurological: She is alert.  Skin: No pallor.  Few spots of what look like eczematous dermatitis on neck, arms  Psychiatric: She has a normal mood and affect. Her behavior is normal. Judgment and thought content normal. Her mood appears not anxious. She does not exhibit a depressed mood.  Good eye contact with examiner   Results for orders placed or performed in visit on 01/31/16  Lipid panel  Result Value Ref Range   Cholesterol 268 (H) <200 mg/dL   Triglycerides 116 <150 mg/dL   HDL 57 >50 mg/dL   Total CHOL/HDL Ratio  4.7 <5.0 Ratio   VLDL 23 <30 mg/dL   LDL Cholesterol 188 (H) <100 mg/dL  CBC with Differential/Platelet  Result Value Ref Range   WBC 4.1 3.8 - 10.8 K/uL  RBC 4.63 3.80 - 5.10 MIL/uL   Hemoglobin 14.0 11.7 - 15.5 g/dL   HCT 42.9 35.0 - 45.0 %   MCV 92.7 80.0 - 100.0 fL   MCH 30.2 27.0 - 33.0 pg   MCHC 32.6 32.0 - 36.0 g/dL   RDW 14.7 11.0 - 15.0 %   Platelets 378 140 - 400 K/uL   MPV 9.8 7.5 - 12.5 fL   Neutro Abs 2,501 1,500 - 7,800 cells/uL   Lymphs Abs 1,025 850 - 3,900 cells/uL   Monocytes Absolute 451 200 - 950 cells/uL   Eosinophils Absolute 82 15 - 500 cells/uL   Basophils Absolute 41 0 - 200 cells/uL   Neutrophils Relative % 61 %   Lymphocytes Relative 25 %   Monocytes Relative 11 %   Eosinophils Relative 2 %   Basophils Relative 1 %   Smear Review Criteria for review not met   COMPLETE METABOLIC PANEL WITH GFR  Result Value Ref Range   Sodium 139 135 - 146 mmol/L   Potassium 4.4 3.5 - 5.3 mmol/L   Chloride 105 98 - 110 mmol/L   CO2 26 20 - 31 mmol/L   Glucose, Bld 88 65 - 99 mg/dL   BUN 20 7 - 25 mg/dL   Creat 1.09 (H) 0.50 - 0.99 mg/dL   Total Bilirubin 0.5 0.2 - 1.2 mg/dL   Alkaline Phosphatase 94 33 - 130 U/L   AST 17 10 - 35 U/L   ALT 10 6 - 29 U/L   Total Protein 6.7 6.1 - 8.1 g/dL   Albumin 4.1 3.6 - 5.1 g/dL   Calcium 9.4 8.6 - 10.4 mg/dL   GFR, Est African American 61 >=60 mL/min   GFR, Est Non African American 53 (L) >=60 mL/min      Assessment & Plan:   Problem List Items Addressed This Visit      Cardiovascular and Mediastinum   Essential hypertension, benign - Primary    Controlled today      Carotid atherosclerosis    Continue statin and aspirin; due for recheck carotid US in July 2018      Relevant Orders   US Carotid Bilateral     Other   Vitamin D deficiency disease    Taking supplement      Vitamin B12 deficiency    Check B12 level and supplement if indicated      Relevant Orders   B12   Medication monitoring  encounter    Check sgpt on the statin, Cr and K+ and ACE-I      Relevant Orders   COMPLETE METABOLIC PANEL WITH GFR   Hyperlipidemia    Patient has had some high LDL readings off of statin, 188 and 189 in the last two years; back on high dose atorvastatin; check lipids on another day fasting; limit saturated fats      Relevant Orders   US Carotid Bilateral   Lipid panel   Depression    Stable on effexor; continue       Other Visit Diagnoses    Screening for osteoporosis       Relevant Orders   DG Bone Density   Screening for breast cancer       Relevant Orders   MM Digital Screening   Angular cheilitis       check B12 level   Relevant Orders   B12       Follow up plan: No Follow-up on file.  An after-visit summary was  printed and given to the patient at [place].  Please see the patient instructions which may contain other information and recommendations beyond what is mentioned above in the assessment and plan.  No orders of the defined types were placed in this encounter.   Orders Placed This Encounter  Procedures  . MM Digital Screening  . DG Bone Density  . US Carotid Bilateral  . B12  . COMPLETE METABOLIC PANEL WITH GFR  . Lipid panel

## 2016-08-01 NOTE — Assessment & Plan Note (Signed)
Controlled today 

## 2016-08-01 NOTE — Patient Instructions (Addendum)
Try to use PLAIN allergy medicine without the decongestant Avoid: phenylephrine, phenylpropanolamine, and pseudoephredine  Try to limit saturated fats in your diet (bologna, hot dogs, barbeque, cheeseburgers, hamburgers, steak, bacon, sausage, cheese, etc.) and get more fresh fruits, vegetables, and whole grains  Return for fasting labs some day this week or next

## 2016-08-01 NOTE — Assessment & Plan Note (Signed)
Check B12 level and supplement if indicated

## 2016-08-01 NOTE — Assessment & Plan Note (Signed)
Taking supplement.

## 2016-08-01 NOTE — Assessment & Plan Note (Addendum)
Patient has had some high LDL readings off of statin, 188 and 189 in the last two years; back on high dose atorvastatin; check lipids on another day fasting; limit saturated fats

## 2016-08-01 NOTE — Assessment & Plan Note (Signed)
Check sgpt on the statin, Cr and K+ and ACE-I

## 2016-08-01 NOTE — Assessment & Plan Note (Signed)
Stable on effexor; continue

## 2016-08-03 LAB — COMPLETE METABOLIC PANEL WITH GFR
ALBUMIN: 3.8 g/dL (ref 3.6–5.1)
ALK PHOS: 92 U/L (ref 33–130)
ALT: 10 U/L (ref 6–29)
AST: 14 U/L (ref 10–35)
BUN: 18 mg/dL (ref 7–25)
CO2: 19 mmol/L — AB (ref 20–31)
Calcium: 9 mg/dL (ref 8.6–10.4)
Chloride: 107 mmol/L (ref 98–110)
Creat: 1.04 mg/dL — ABNORMAL HIGH (ref 0.50–0.99)
GFR, EST AFRICAN AMERICAN: 64 mL/min (ref >=60)
GFR, EST NON AFRICAN AMERICAN: 56 mL/min — AB (ref >=60)
Glucose, Bld: 97 mg/dL (ref 65–99)
POTASSIUM: 4.5 mmol/L (ref 3.5–5.3)
Sodium: 140 mmol/L (ref 135–146)
TOTAL PROTEIN: 6.2 g/dL (ref 6.1–8.1)
Total Bilirubin: 0.5 mg/dL (ref 0.2–1.2)

## 2016-08-03 LAB — LIPID PANEL
CHOL/HDL RATIO: 3.1 ratio (ref <5.0)
Cholesterol: 159 mg/dL (ref <200)
HDL: 52 mg/dL (ref >50)
LDL Cholesterol: 92 mg/dL (ref <100)
TRIGLYCERIDES: 75 mg/dL (ref <150)
VLDL: 15 mg/dL (ref <30)

## 2016-08-04 ENCOUNTER — Other Ambulatory Visit: Payer: Self-pay | Admitting: Family Medicine

## 2016-08-04 LAB — VITAMIN B12: VITAMIN B 12: 311 pg/mL (ref 200–1100)

## 2016-08-04 MED ORDER — ATORVASTATIN CALCIUM 80 MG PO TABS: 80.0000 mg | ORAL_TABLET | Freq: Every day | ORAL | 3 refills | Status: DC

## 2016-08-04 NOTE — Progress Notes (Signed)
rx for statin sent 

## 2016-08-08 DIAGNOSIS — H5200 Hypermetropia, unspecified eye: Secondary | ICD-10-CM | POA: Diagnosis not present

## 2016-09-18 ENCOUNTER — Ambulatory Visit
Admission: RE | Admit: 2016-09-18 | Discharge: 2016-09-18 | Disposition: A | Discharge status: Home / Self Care | Payer: Medicare HMO | Source: Ambulatory Visit | Attending: Family Medicine | Admitting: Family Medicine

## 2016-09-18 DIAGNOSIS — I6523 Occlusion and stenosis of bilateral carotid arteries: Secondary | ICD-10-CM | POA: Diagnosis not present

## 2016-09-18 DIAGNOSIS — E782 Mixed hyperlipidemia: Secondary | ICD-10-CM | POA: Diagnosis not present

## 2016-09-18 DIAGNOSIS — I6521 Occlusion and stenosis of right carotid artery: Secondary | ICD-10-CM

## 2016-11-02 ENCOUNTER — Ambulatory Visit (INDEPENDENT_AMBULATORY_CARE_PROVIDER_SITE_OTHER): Payer: Medicare HMO | Admitting: Family Medicine

## 2016-11-02 ENCOUNTER — Encounter: Payer: Self-pay | Admitting: Family Medicine

## 2016-11-02 VITALS — BP 124/72 | HR 95 | Temp 98.3°F | Resp 14 | Wt 146.3 lb

## 2016-11-02 DIAGNOSIS — N183 Chronic kidney disease, stage 3 unspecified: Secondary | ICD-10-CM

## 2016-11-02 DIAGNOSIS — I6521 Occlusion and stenosis of right carotid artery: Secondary | ICD-10-CM | POA: Diagnosis not present

## 2016-11-02 DIAGNOSIS — I10 Essential (primary) hypertension: Secondary | ICD-10-CM | POA: Diagnosis not present

## 2016-11-02 DIAGNOSIS — F3341 Major depressive disorder, recurrent, in partial remission: Secondary | ICD-10-CM

## 2016-11-02 DIAGNOSIS — Z23 Encounter for immunization: Secondary | ICD-10-CM

## 2016-11-02 DIAGNOSIS — M5431 Sciatica, right side: Secondary | ICD-10-CM

## 2016-11-02 DIAGNOSIS — E538 Deficiency of other specified B group vitamins: Secondary | ICD-10-CM

## 2016-11-02 DIAGNOSIS — N182 Chronic kidney disease, stage 2 (mild): Secondary | ICD-10-CM | POA: Insufficient documentation

## 2016-11-02 DIAGNOSIS — E782 Mixed hyperlipidemia: Secondary | ICD-10-CM | POA: Diagnosis not present

## 2016-11-02 NOTE — Patient Instructions (Signed)
If you need something for aches or pains, try to use Tylenol (acetaminophen) instead of non-steroidals (which include Aleve, ibuprofen, Advil, Motrin, and naproxen); non-steroidals can cause long-term kidney damage Return in 3 months for a visit with fasting labs

## 2016-11-02 NOTE — Assessment & Plan Note (Signed)
Taking supplement.

## 2016-11-02 NOTE — Assessment & Plan Note (Signed)
Explained diagnosis; hydration encouraged, limit NSAIDs; monitor at next visit

## 2016-11-02 NOTE — Assessment & Plan Note (Signed)
Doing very well, glad she is getting out

## 2016-11-02 NOTE — Progress Notes (Signed)
BP 124/72   Pulse 95   Temp 98.3 F (36.8 C) (Oral)   Resp 14   Wt 146 lb 4.8 oz (66.4 kg)   SpO2 95%   BMI 29.55 kg/m    Subjective:    Patient ID: Sarah Gillespie, female    DOB: [birth year], 66 y.o.   MRN: [id number]  HPI: Sarah Gillespie is a 66 y.o. female  Chief Complaint  Patient presents with  . Follow-up    3 months    HPI Patient is here for f/u No medical excitement Flu shot today  HTN; she has trouble taking her medicines before, but now her friends is texting her every day to help her be accountable; she is helping her build a habit; not adding much salt at all, no decongestants; no black licorice  High cholesterol; does not eat much fatty foods; does not fry; no bacon in quite a while unless specks in a sandwich; 1-2 eggs a week; not much cheese; taking medicines regularly Lab Results  Component Value Date   CHOL 159 08/01/2016   CHOL 268 (H) 01/31/2016   CHOL 175 08/09/2015   Lab Results  Component Value Date   HDL 52 08/01/2016   HDL 57 01/31/2016   HDL 54 08/09/2015   Lab Results  Component Value Date   LDLCALC 92 08/01/2016   LDLCALC 188 (H) 01/31/2016   LDLCALC 99 08/09/2015   Lab Results  Component Value Date   TRIG 75 08/01/2016   TRIG 116 01/31/2016   TRIG 110 08/09/2015   Lab Results  Component Value Date   CHOLHDL 3.1 08/01/2016   CHOLHDL 4.7 01/31/2016   No results found for: LDLDIRECT  Mood; taking low dose; doing well  Carotid atherosclerosis; reviewed the US carotid bilateral from September 18, 2016; next due in 2019  Sciatica on the left more than right lately; taking celebrex; no blood in stools  Mammogram and DEXA soon; no breast lumps  Low B12 in May, taking supplement; good energy  Depression screen [facility] 2/9 11/02/2016 08/01/2016 01/31/2016 11/04/2015 10/08/2015  Decreased Interest 0 0 0 0 1  Down, Depressed, Hopeless 0 1 0 1 3  PHQ - 2 Score 0 1 0 1 4  Altered sleeping - - - - 3  Tired, decreased energy - - - - 3    Change in appetite - - - - 0  Feeling bad or failure about yourself  - - - - 0  Trouble concentrating - - - - 1  Moving slowly or fidgety/restless - - - - 0  Suicidal thoughts - - - - 0  PHQ-9 Score - - - - 11  Difficult doing work/chores - - - - Somewhat difficult    Relevant past medical, surgical, family and social history reviewed Past Medical History:  Diagnosis Date  . Angular cheilitis   . Anxiety   . Carotid atherosclerosis   . Carotid bruit   . Depression   . Elevated blood pressure   . Frequency   . Gross hematuria   . Hyperlipidemia   . Nocturia   . Pyuria   . Sigmoid diverticulosis Oct. 2015   CT scan  . Vitamin B12 deficiency   . Vitamin D deficiency disease    Past Surgical History:  Procedure Laterality Date  . ABDOMINAL HYSTERECTOMY  1987  . APPENDECTOMY  1960  . CARPAL TUNNEL RELEASE Left 1988  . EXCISION NEUROMA Left 1988  . TONSILLECTOMY  1962   Family History  Problem Relation Age of Onset  . Hypertension Mother   . Heart disease Mother   . Clotting disorder Mother   . Other Sister        muscle disease  . ALS Father   . Urolithiasis Father   . Thyroid disease Father   . Thyroid disease Sister   . Clotting disorder Sister   . Cancer Brother 50       lung, brain cancer  . Diabetes Maternal Grandmother   . Stroke Brother    Social History   Social History  . Marital status: Divorced    Spouse name: N/A  . Number of children: N/A  . Years of education: N/A   Occupational History  . Not on file.   Social History Main Topics  . Smoking status: Never Smoker  . Smokeless tobacco: Never Used  . Alcohol use Yes     Comment: occasional wine  . Drug use: No  . Sexual activity: Not Currently   Other Topics Concern  . Not on file   Social History Narrative  . No narrative on file    Interim medical history since last visit reviewed. Allergies and medications reviewed  Review of Systems Per HPI unless specifically indicated  above     Objective:    BP 124/72   Pulse 95   Temp 98.3 F (36.8 C) (Oral)   Resp 14   Wt 146 lb 4.8 oz (66.4 kg)   SpO2 95%   BMI 29.55 kg/m   Wt Readings from Last 3 Encounters:  11/02/16 146 lb 4.8 oz (66.4 kg)  08/01/16 146 lb 11.2 oz (66.5 kg)  01/31/16 147 lb (66.7 kg)    Physical Exam  Constitutional: She appears well-developed and well-nourished. No distress.  HENT:  Mouth/Throat: Mucous membranes are normal. She has dentures.  Eyes: EOM are normal. No scleral icterus.  Neck: Carotid bruit is not present. No thyromegaly present.  Cardiovascular: Normal rate and regular rhythm.   Pulmonary/Chest: Effort normal and breath sounds normal. She has no wheezes.  Abdominal: Soft. Bowel sounds are normal. She exhibits no distension.  Musculoskeletal: She exhibits no edema.  Neurological: She is alert.  Skin: No pallor.  Psychiatric: She has a normal mood and affect. Her behavior is normal. Judgment and thought content normal. Her mood appears not anxious. She does not exhibit a depressed mood.  Good eye contact with examiner   Results for orders placed or performed in visit on 08/01/16  B12  Result Value Ref Range   Vitamin B-12 311 200 - 1,100 pg/mL  COMPLETE METABOLIC PANEL WITH GFR  Result Value Ref Range   Sodium 140 135 - 146 mmol/L   Potassium 4.5 3.5 - 5.3 mmol/L   Chloride 107 98 - 110 mmol/L   CO2 19 (L) 20 - 31 mmol/L   Glucose, Bld 97 65 - 99 mg/dL   BUN 18 7 - 25 mg/dL   Creat 1.04 (H) 0.50 - 0.99 mg/dL   Total Bilirubin 0.5 0.2 - 1.2 mg/dL   Alkaline Phosphatase 92 33 - 130 U/L   AST 14 10 - 35 U/L   ALT 10 6 - 29 U/L   Total Protein 6.2 6.1 - 8.1 g/dL   Albumin 3.8 3.6 - 5.1 g/dL   Calcium 9.0 8.6 - 10.4 mg/dL   GFR, Est African American 64 >=60 mL/min   GFR, Est Non African American 56 (L) >=60 mL/min  Lipid panel  Result Value Ref Range  Cholesterol 159 <200 mg/dL   Triglycerides 75 <150 mg/dL   HDL 52 >50 mg/dL   Total CHOL/HDL Ratio 3.1  <5.0 Ratio   VLDL 15 <30 mg/dL   LDL Cholesterol 92 <100 mg/dL      Assessment & Plan:   Problem List Items Addressed This Visit      Cardiovascular and Mediastinum   Essential hypertension, benign - Primary    Well-controlled; continue ACE-I      Carotid atherosclerosis    Next carotid US due July 2019; goal LDL less than 70        Nervous and Auditory   Sciatica    Controlled for the most part with one daily celebrex        Genitourinary   Chronic kidney disease (CKD), stage III (moderate)    Explained diagnosis; hydration encouraged, limit NSAIDs; monitor at next visit        Other   Vitamin B12 deficiency    Taking supplement      Hyperlipidemia    Continue to watch diet and take statin; recheck fasting lipids at next visit      Depression    Doing very well, glad she is getting out       Other Visit Diagnoses    Needs flu shot       Relevant Orders   Flu vaccine HIGH DOSE PF (Fluzone High dose) (Completed)       Follow up plan: Return for twenty minute follow-up with fasting labs.  An after-visit summary was printed and given to the patient at [place].  Please see the patient instructions which may contain other information and recommendations beyond what is mentioned above in the assessment and plan.  Meds ordered this encounter  Medications  . vitamin B-12 (CYANOCOBALAMIN) 1000 MCG tablet    Sig: Take 1 tablet by mouth daily.    Orders Placed This Encounter  Procedures  . Flu vaccine HIGH DOSE PF (Fluzone High dose)

## 2016-11-02 NOTE — Assessment & Plan Note (Signed)
Controlled for the most part with one daily celebrex

## 2016-11-02 NOTE — Assessment & Plan Note (Signed)
Continue to watch diet and take statin; recheck fasting lipids at next visit

## 2016-11-02 NOTE — Assessment & Plan Note (Signed)
Well-controlled; continue ACE-I

## 2016-11-02 NOTE — Assessment & Plan Note (Signed)
Next carotid US due July 2019; goal LDL less than 70

## 2016-12-19 ENCOUNTER — Other Ambulatory Visit: Payer: Self-pay | Admitting: Family Medicine

## 2017-01-18 IMAGING — US US CAROTID DUPLEX BILAT
1 series · 13 of 24 positions shown · non-contrast
Comparison: Ultrasound December 23, 2014.

CLINICAL DATA: Bilateral carotid artery atherosclerosis.

EXAM:
BILATERAL CAROTID DUPLEX ULTRASOUND
TECHNIQUE: Gray scale imaging, color Doppler and duplex ultrasound were
performed of bilateral carotid and vertebral arteries in the neck.

[Series 1: us carotid duplex bilat · 0.06mm/px · 13 of 62 slices shown]
[im 1/62]
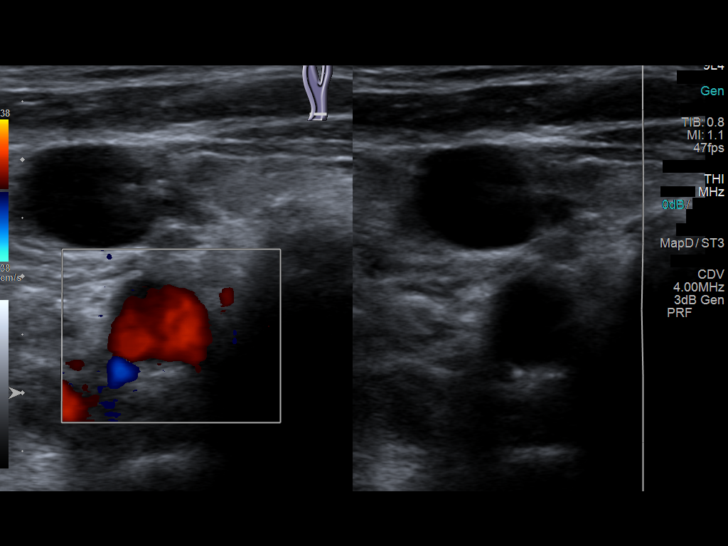
[im 6/62]
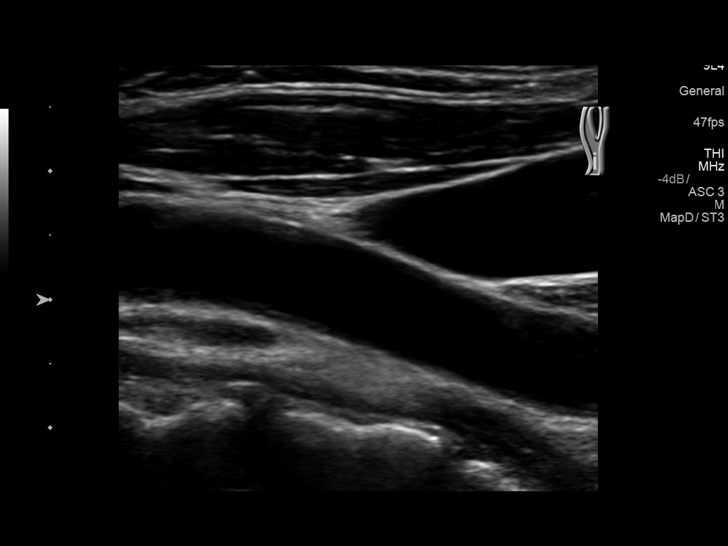
[im 11/62]
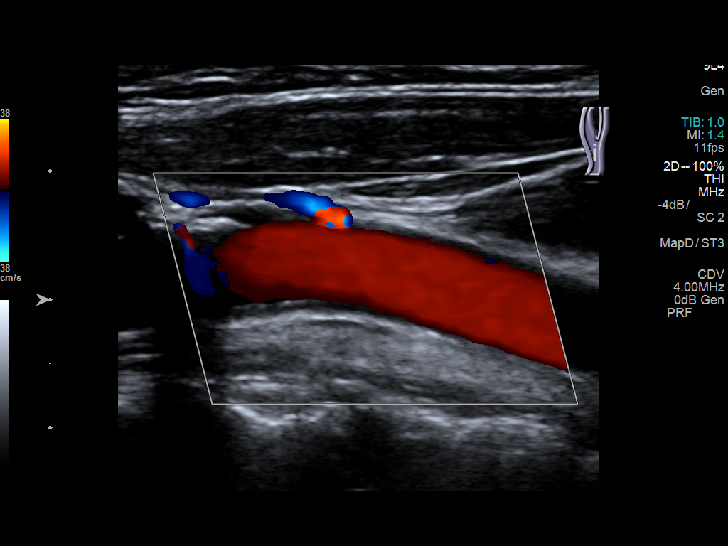
[im 16/62]
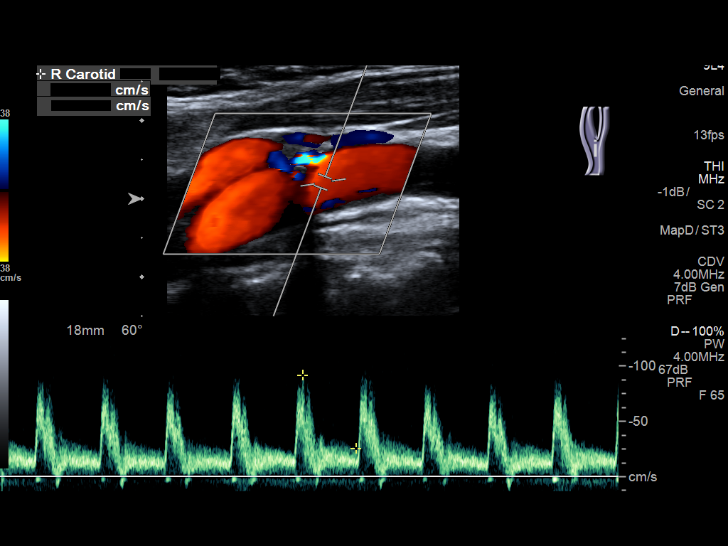
[im 22/62]
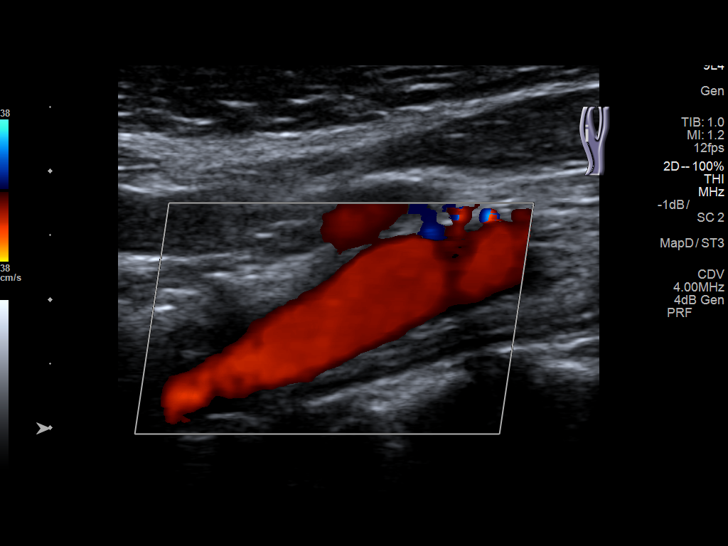
[im 27/62]
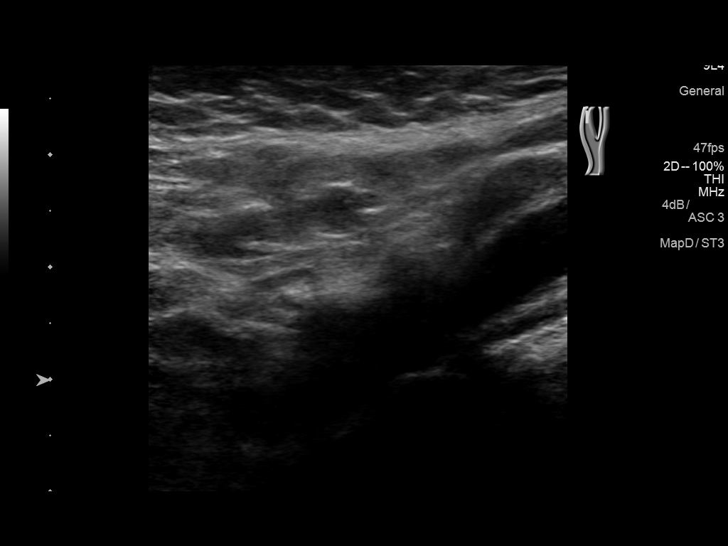
[im 32/62]
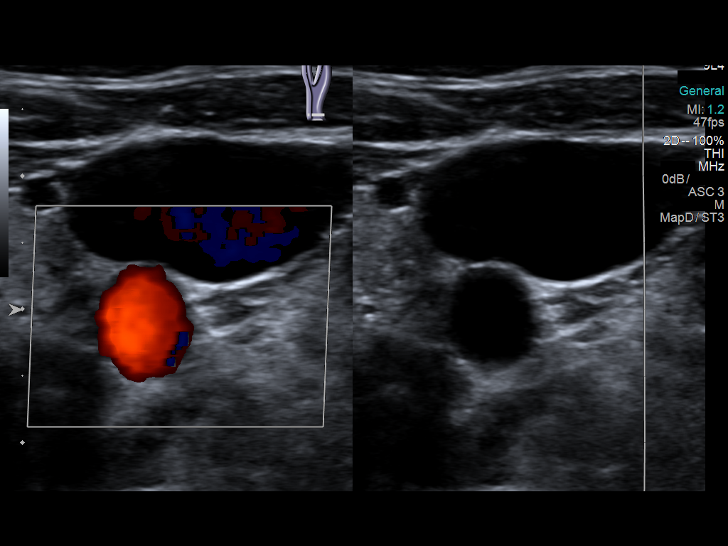
[im 35/62]
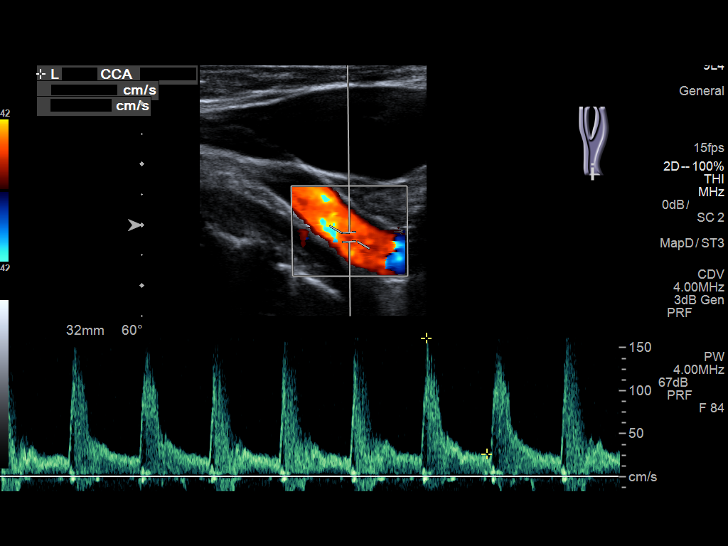
[im 40/62]
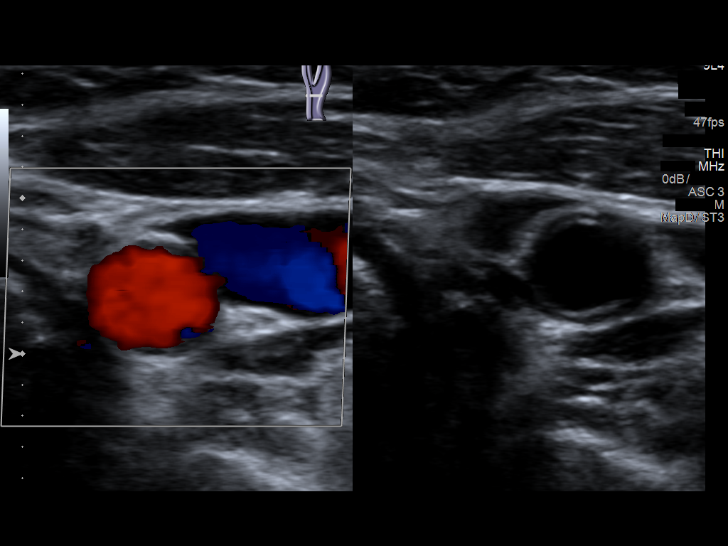
[im 46/62]
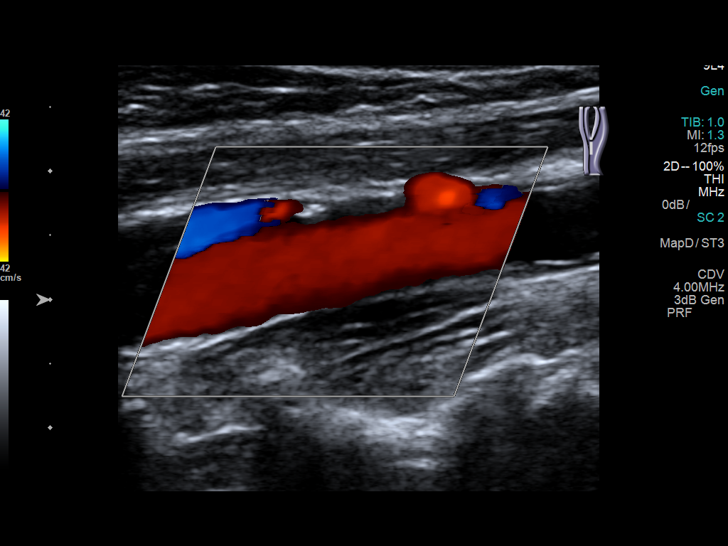
[im 51/62]
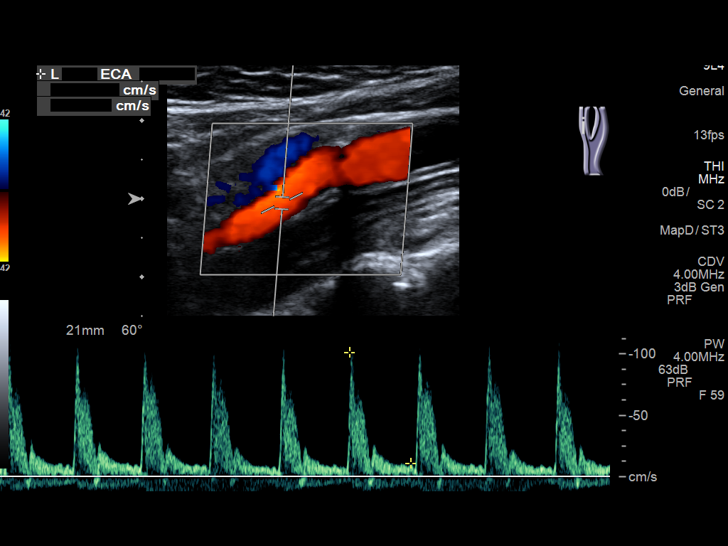
[im 56/62]
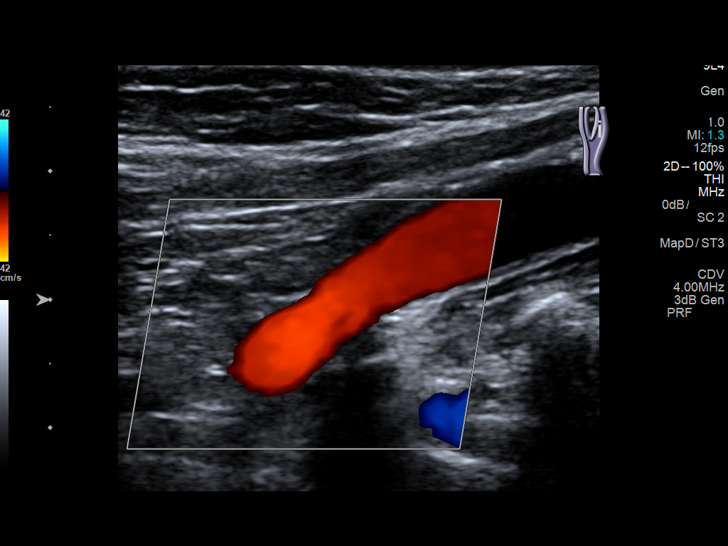
[im 62/62]
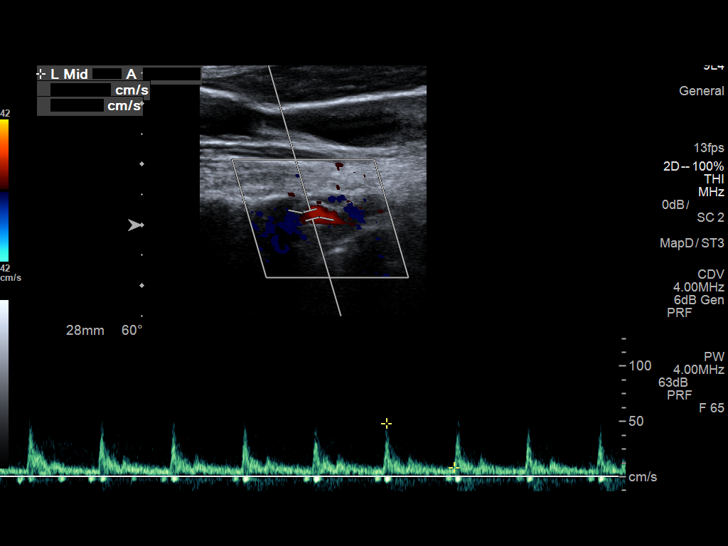

[13 of 24 positions shown; findings below may reference images not displayed]

FINDINGS: Criteria: Quantification of carotid stenosis is based on velocity
parameters that correlate the residual internal carotid diameter
with NASCET-based stenosis levels, using the diameter of the distal
internal carotid lumen as the denominator for stenosis measurement.

The following velocity measurements were obtained:

RIGHT

ICA:  136/42 cm/sec

CCA:  103/27 cm/sec

SYSTOLIC ICA/CCA RATIO:

DIASTOLIC ICA/CCA RATIO:

ECA:  128 cm/sec

LEFT

ICA:  116/41 cm/sec

CCA:  99/24 cm/sec

SYSTOLIC ICA/CCA RATIO:

DIASTOLIC ICA/CCA RATIO:

ECA:  101 cm/sec

RIGHT CAROTID ARTERY: Mild eccentric calcified plaque formation is
noted in the right carotid bulb and proximal right internal carotid
artery consistent with less than 50% diameter stenosis despite the
elevated measured Doppler velocities, which most likely are
artifactually elevated.

RIGHT VERTEBRAL ARTERY:  Antegrade flow is noted.

LEFT CAROTID ARTERY: Minimal calcified plaque formation is noted in
the left carotid bulb and proximal left internal carotid artery. No
significant stenosis or plaque is noted, and the elevated measured
velocities most likely artifactually high.

LEFT VERTEBRAL ARTERY:  Antegrade flow is noted.
IMPRESSION: No hemodynamically significant stenosis or plaque is noted in the
left internal carotid artery.

Mild eccentric calcified plaque is noted in the right carotid bulb
and proximal right internal carotid artery consistent with less than
50% diameter stenosis based on ultrasound criteria. Measured Doppler
velocities are high, but this is felt most likely to be due to
artifact.

These findings are not significantly changed compared to prior exam.

## 2017-02-02 ENCOUNTER — Ambulatory Visit: Payer: Medicare HMO | Admitting: Family Medicine

## 2017-02-02 ENCOUNTER — Encounter: Payer: Self-pay | Admitting: Family Medicine

## 2017-02-02 VITALS — BP 124/66 | HR 89 | Temp 97.7°F | Resp 16 | Wt 151.7 lb

## 2017-02-02 DIAGNOSIS — R635 Abnormal weight gain: Secondary | ICD-10-CM | POA: Diagnosis not present

## 2017-02-02 DIAGNOSIS — I6521 Occlusion and stenosis of right carotid artery: Secondary | ICD-10-CM | POA: Diagnosis not present

## 2017-02-02 DIAGNOSIS — I10 Essential (primary) hypertension: Secondary | ICD-10-CM

## 2017-02-02 DIAGNOSIS — N183 Chronic kidney disease, stage 3 unspecified: Secondary | ICD-10-CM

## 2017-02-02 DIAGNOSIS — E782 Mixed hyperlipidemia: Secondary | ICD-10-CM | POA: Diagnosis not present

## 2017-02-02 DIAGNOSIS — E538 Deficiency of other specified B group vitamins: Secondary | ICD-10-CM

## 2017-02-02 DIAGNOSIS — Z23 Encounter for immunization: Secondary | ICD-10-CM | POA: Insufficient documentation

## 2017-02-02 NOTE — Assessment & Plan Note (Signed)
Ordered PPSV-23 today; she will not need another pneumonia booster or either type for the rest of her life per current ACIP guidelines

## 2017-02-02 NOTE — Assessment & Plan Note (Signed)
Continue supplement; check level today

## 2017-02-02 NOTE — Assessment & Plan Note (Signed)
Check lipids; avoid saturated fats

## 2017-02-02 NOTE — Assessment & Plan Note (Addendum)
Controlled today; monitor Cr

## 2017-02-02 NOTE — Patient Instructions (Addendum)
You have received the Pneumovax vaccine (PPSV-23) and you will not need another booster of this for the rest of your life per current ACIP guidelines We'll get labs today Return in 6 months Medicare Wellness visit when due, sometime in the next few months

## 2017-02-02 NOTE — Progress Notes (Signed)
BP 124/66   Pulse 89   Temp 97.7 F (36.5 C) (Oral)   Resp 16   Wt 151 lb 11.2 oz (68.8 kg)   SpO2 98%   BMI 30.64 kg/m    Subjective:    Patient ID: Sarah Gillespie, female    DOB: 06-08-1950, 66 y.o.   MRN: [id number]  HPI: Sarah Gillespie is a 66 y.o. female  Chief Complaint  Patient presents with  . Follow-up    HPI  HTN; well-controlled today ; on ACE-I  Patient has CKD stage 3; stopped taking the celebrex; just taking tylenol for her sciatica and using heat More active, volunteering at book store She is thinking of joining [employer] and then will join after the holidays; just starting with five minutes on a bicycle, will just do what she can and work her up slowly  Carotid atheroscerosis; no sx of TIA or stroke; taking statin and aspirin Not eating very many eggs,.just once a week  Depression; on effexor; doing well, "so far so good"; doing okay around the holidays; good support with lady across the street  Vitamin B12 deficiency; taking supplement  Gaining weight; gained five pounds; not overeating; she says there are family members who have thyroid disease  Depression screen [facility] 2/9 02/02/2017 11/02/2016 08/01/2016 01/31/2016 11/04/2015  Decreased Interest 0 0 0 0 0  Down, Depressed, Hopeless 0 0 1 0 1  PHQ - 2 Score 0 0 1 0 1  Altered sleeping - - - - -  Tired, decreased energy - - - - -  Change in appetite - - - - -  Feeling bad or failure about yourself  - - - - -  Trouble concentrating - - - - -  Moving slowly or fidgety/restless - - - - -  Suicidal thoughts - - - - -  PHQ-9 Score - - - - -  Difficult doing work/chores - - - - -    Relevant past medical, surgical, family and social history reviewed Past Medical History:  Diagnosis Date  . Angular cheilitis   . Anxiety   . Carotid atherosclerosis   . Carotid bruit   . Depression   . Elevated blood pressure   . Frequency   . Gross hematuria   . Hyperlipidemia   . Nocturia   . Pyuria     . Sigmoid diverticulosis Oct. 2015   CT scan  . Vitamin B12 deficiency   . Vitamin D deficiency disease    Past Surgical History:  Procedure Laterality Date  . ABDOMINAL HYSTERECTOMY  1987  . APPENDECTOMY  1960  . CARPAL TUNNEL RELEASE Left 1988  . EXCISION NEUROMA Left 1988  . TONSILLECTOMY  1962   Family History  Problem Relation Age of Onset  . Hypertension Mother   . Heart disease Mother   . Clotting disorder Mother   . Other Sister        muscle disease  . ALS Father   . Urolithiasis Father   . Thyroid disease Father   . Thyroid disease Sister   . Clotting disorder Sister   . Cancer Brother 50       lung, brain cancer  . Diabetes Maternal Grandmother   . Stroke Brother    Social History   Tobacco Use  . Smoking status: Never Smoker  . Smokeless tobacco: Never Used  Substance Use Topics  . Alcohol use: Yes    Comment: occasional wine  . Drug use: No  Interim medical history since last visit reviewed. Allergies and medications reviewed  Review of Systems Per HPI unless specifically indicated above     Objective:    BP 124/66   Pulse 89   Temp 97.7 F (36.5 C) (Oral)   Resp 16   Wt 151 lb 11.2 oz (68.8 kg)   SpO2 98%   BMI 30.64 kg/m   Wt Readings from Last 3 Encounters:  02/02/17 151 lb 11.2 oz (68.8 kg)  11/02/16 146 lb 4.8 oz (66.4 kg)  08/01/16 146 lb 11.2 oz (66.5 kg)    Physical Exam  Constitutional: She appears well-developed and well-nourished. No distress.  Weight gain noted  HENT:  Mouth/Throat: Mucous membranes are normal. She has dentures.  Eyes: EOM are normal. No scleral icterus.  Neck: Carotid bruit is not present. No thyromegaly present.  Cardiovascular: Normal rate and regular rhythm.  Pulmonary/Chest: Effort normal and breath sounds normal. She has no wheezes.  Abdominal: Soft. Bowel sounds are normal. She exhibits no distension.  Musculoskeletal: She exhibits no edema.  Neurological: She is alert.  Skin: No pallor.   Psychiatric: She has a normal mood and affect. Her behavior is normal. Judgment and thought content normal. Her mood appears not anxious. She does not exhibit a depressed mood.  Good eye contact with examiner    Results for orders placed or performed in visit on 08/01/16  B12  Result Value Ref Range   Vitamin B-12 311 200 - 1,100 pg/mL  COMPLETE METABOLIC PANEL WITH GFR  Result Value Ref Range   Sodium 140 135 - 146 mmol/L   Potassium 4.5 3.5 - 5.3 mmol/L   Chloride 107 98 - 110 mmol/L   CO2 19 (L) 20 - 31 mmol/L   Glucose, Bld 97 65 - 99 mg/dL   BUN 18 7 - 25 mg/dL   Creat 1.04 (H) 0.50 - 0.99 mg/dL   Total Bilirubin 0.5 0.2 - 1.2 mg/dL   Alkaline Phosphatase 92 33 - 130 U/L   AST 14 10 - 35 U/L   ALT 10 6 - 29 U/L   Total Protein 6.2 6.1 - 8.1 g/dL   Albumin 3.8 3.6 - 5.1 g/dL   Calcium 9.0 8.6 - 10.4 mg/dL   GFR, Est African American 64 >=60 mL/min   GFR, Est Non African American 56 (L) >=60 mL/min  Lipid panel  Result Value Ref Range   Cholesterol 159 <200 mg/dL   Triglycerides 75 <150 mg/dL   HDL 52 >50 mg/dL   Total CHOL/HDL Ratio 3.1 <5.0 Ratio   VLDL 15 <30 mg/dL   LDL Cholesterol 92 <100 mg/dL      Assessment & Plan:   Problem List Items Addressed This Visit      Cardiovascular and Mediastinum   Essential hypertension, benign    Controlled today; monitor Cr      Carotid atherosclerosis    Next scan due July 2019; try to keep LDL less than 70        Genitourinary   Chronic kidney disease (CKD), stage III (moderate) (HCC) - Primary    Avoid NSAIDS, stay well-hydrated      Relevant Orders   COMPLETE METABOLIC PANEL WITH GFR     Other   Vitamin B12 deficiency    Continue supplement; check level today      Relevant Orders   B12   Need for 23-polyvalent pneumococcal polysaccharide vaccine    Ordered PPSV-23 today; she will not need another pneumonia booster or  either type for the rest of her life per current ACIP guidelines      Hyperlipidemia     Check lipids; avoid saturated fats      Relevant Orders   Lipid panel    Other Visit Diagnoses    Weight gain       Relevant Orders   TSH       Follow up plan: Return in about 6 months (around 08/02/2017) for twenty minute follow-up with fasting labs; Medicare wellness visit in 2 months.  An after-visit summary was printed and given to the patient at [place].  Please see the patient instructions which may contain other information and recommendations beyond what is mentioned above in the assessment and plan.  No orders of the defined types were placed in this encounter.   Orders Placed This Encounter  Procedures  . Pneumococcal polysaccharide vaccine 23-valent greater than or equal to 2yo subcutaneous/IM  . TSH  . Lipid panel  . COMPLETE METABOLIC PANEL WITH GFR  . B12

## 2017-02-02 NOTE — Assessment & Plan Note (Signed)
Avoid NSAIDS, stay well-hydrated

## 2017-02-02 NOTE — Assessment & Plan Note (Signed)
Next scan due July 2019; try to keep LDL less than 70

## 2017-02-03 LAB — COMPLETE METABOLIC PANEL WITH GFR
AG RATIO: 1.6 (calc) (ref 1.0–2.5)
ALT: 19 U/L (ref 6–29)
AST: 22 U/L (ref 10–35)
Albumin: 4 g/dL (ref 3.6–5.1)
Alkaline phosphatase (APISO): 87 U/L (ref 33–130)
BUN: 14 mg/dL (ref 7–25)
CALCIUM: 9.4 mg/dL (ref 8.6–10.4)
CO2: 25 mmol/L (ref 20–32)
CREATININE: 0.93 mg/dL (ref 0.50–0.99)
Chloride: 106 mmol/L (ref 98–110)
GFR, EST AFRICAN AMERICAN: 73 mL/min/{1.73_m2} (ref >=60)
GFR, EST NON AFRICAN AMERICAN: 63 mL/min/{1.73_m2} (ref >=60)
GLOBULIN: 2.5 g/dL (ref 1.9–3.7)
Glucose, Bld: 108 mg/dL — ABNORMAL HIGH (ref 65–99)
POTASSIUM: 4.5 mmol/L (ref 3.5–5.3)
SODIUM: 140 mmol/L (ref 135–146)
TOTAL PROTEIN: 6.5 g/dL (ref 6.1–8.1)
Total Bilirubin: 0.5 mg/dL (ref 0.2–1.2)

## 2017-02-03 LAB — LIPID PANEL
Cholesterol: 253 mg/dL — ABNORMAL HIGH (ref <200)
HDL: 57 mg/dL (ref >50)
LDL Cholesterol (Calc): 165 mg/dL (calc) — ABNORMAL HIGH
Non-HDL Cholesterol (Calc): 196 mg/dL (calc) — ABNORMAL HIGH (ref <130)
Total CHOL/HDL Ratio: 4.4 (calc) (ref <5.0)
Triglycerides: 162 mg/dL — ABNORMAL HIGH (ref <150)

## 2017-02-03 LAB — TSH: TSH: 2.24 mIU/L (ref 0.40–4.50)

## 2017-02-03 LAB — VITAMIN B12: Vitamin B-12: 353 pg/mL (ref 200–1100)

## 2017-02-05 ENCOUNTER — Other Ambulatory Visit: Payer: Self-pay | Admitting: Family Medicine

## 2017-02-05 DIAGNOSIS — I6521 Occlusion and stenosis of right carotid artery: Secondary | ICD-10-CM

## 2017-02-05 DIAGNOSIS — E782 Mixed hyperlipidemia: Secondary | ICD-10-CM

## 2017-03-05 ENCOUNTER — Encounter: Payer: Self-pay | Admitting: Family Medicine

## 2017-03-05 ENCOUNTER — Ambulatory Visit: Payer: Self-pay | Admitting: *Deleted

## 2017-03-05 ENCOUNTER — Ambulatory Visit: Payer: Medicare HMO | Admitting: Family Medicine

## 2017-03-05 VITALS — BP 124/84 | HR 96 | Temp 98.5°F | Ht 59.0 in | Wt 152.7 lb

## 2017-03-05 DIAGNOSIS — Z1231 Encounter for screening mammogram for malignant neoplasm of breast: Secondary | ICD-10-CM

## 2017-03-05 DIAGNOSIS — R42 Dizziness and giddiness: Secondary | ICD-10-CM | POA: Diagnosis not present

## 2017-03-05 DIAGNOSIS — Z780 Asymptomatic menopausal state: Secondary | ICD-10-CM | POA: Diagnosis not present

## 2017-03-05 DIAGNOSIS — E538 Deficiency of other specified B group vitamins: Secondary | ICD-10-CM

## 2017-03-05 DIAGNOSIS — R011 Cardiac murmur, unspecified: Secondary | ICD-10-CM

## 2017-03-05 HISTORY — DX: Cardiac murmur, unspecified: R01.1

## 2017-03-05 MED ORDER — CYANOCOBALAMIN 1000 MCG/ML IJ SOLN
1000.0000 ug | Freq: Once | INTRAMUSCULAR | Status: AC
  Administered 2017-03-05: 1000 ug via INTRAMUSCULAR

## 2017-03-05 NOTE — Patient Instructions (Signed)
We'll get an echocardiogram and have you see Dr. Ubaldo Glassing If you get worse or have another event, please call 911 Stay well-hydrated and get plenty of rest

## 2017-03-05 NOTE — Telephone Encounter (Signed)
Pt  Reports   Symptoms    Of   dizzyness    sev  Days  Ago  And  Almost passed  Out  She  Reports   Symptoms  Are  Better  At  This  Time  .  She  Is  Awake  And  Alert  As   Well  As  Oriented . Today  Is  New  Years  Eve    And    Her  Doctors  Office  Is  Going  To close  Early  Today   And   Will  Be  Closed  As   Well  tommorow . Miel  At   Dr  Jarold Motto  Office  Called  And  Made  Her  An  Appointment  Today  With Dr lada   At  1  Pm  Today     Reason for Disposition . [1] MODERATE dizziness (e.g., interferes with normal activities) AND [2] has been evaluated by physician for this  Answer Assessment - Initial Assessment Questions 1. DESCRIPTION: "Describe your dizziness."      Equilbirium   Off      2. LIGHTHEADED: "Do you feel lightheaded?" (e.g., somewhat faint, woozy, weak upon standing)      Yes   Woozy     Subsided   Somewhat     3. VERTIGO: "Do you feel like either you or the room is spinning or tilting?" (i.e. vertigo)        No 4. SEVERITY: "How bad is it?"  "Do you feel like you are going to faint?" "Can you stand and walk?"   - MILD - walking normally   - MODERATE - interferes with normal activities (e.g., work, school)    - SEVERE - unable to stand, requires support to walk, feels like passing out now.       No   Symptoms    5. ONSET:  "When did the dizziness begin?"       2  Days   ago 6. AGGRAVATING FACTORS: "Does anything make it worse?" (e.g., standing, change in head position)        Now    She  Sat  Down  And  Let  It  Pass   7. HEART RATE: "Can you tell me your heart rate?" "How many beats in 15 seconds?"  (Note: not all patients can do this)         No  8. CAUSE: "What do you think is causing the dizziness?"        No   Clue   9. RECURRENT SYMPTOM: "Have you had dizziness before?" If so, ask: "When was the last time?" "What happened that time?"       Had   Similar  Episode 5-6  Years  Ago   10. OTHER SYMPTOMS: "Do you have any other symptoms?" (e.g., fever, chest pain,  vomiting, diarrhea, bleeding)        No 11. PREGNANCY: "Is there any chance you are pregnant?" "When was your last menstrual period?"       N/a  Protocols used: DIZZINESS [facility]

## 2017-03-05 NOTE — Progress Notes (Signed)
BP 124/84 (BP Location: Left Arm, Patient Position: Sitting, Cuff Size: Large)   Pulse 96   Temp 98.5 F (36.9 C) (Oral)   Ht 4\' 11"  (1.499 m)   Wt 152 lb 11.2 oz (69.3 kg)   SpO2 99%   BMI 30.84 kg/m    Subjective:    Patient ID: Sarah Gillespie, female    DOB: 1950-04-04, 66 y.o.   MRN: [id number]  HPI: Sarah Gillespie is a 66 y.o. female  Chief Complaint  Patient presents with  . Dizziness    Pt states that she has had dizziness since Saturday, initial epsisode has sweating, flushed, had a similar epsiode about 6 years ago  . B12 Injection    Pt states that she was supposed to start injections but never did     HPI Patient is here for an acute visit On Saturday, she had been shopping with a friend Came out of a store and felt dizzy; hot, sweaty, and sat down Whole episode lasted 20 minutes; then felt good enough to get up and go to the car; then it passed; felt a little weak and drained; felt drained yesterday too Hadar and a little off-kilter even in the store Had lunch, was hydrated, drinking fluids Not excessive walking Slight headache a few days before; that was gone No chest pain, no sensation of heart fluttering or being out of rhythm No trouble with breathing Just felt a little off Gets headaches on the right side a lot, takes tylenol and lays down; has problems with her eye; she saw a neurologist and they did all kinds of MRIs and nothing showed up No sensation of fullness or drainage from the ears Just a dry cough and sneezing once in a while No hx of aneurysms in her or her family No hx of stroke She had this happen to her six years ago; was at [facility]; sat down and manager came over, they called her daughter to come get her; they thought it was a B12 and started hre on shots and that helped and she has not had an episode since She saw a cardiologist in the past too once; Nov 2015; no murmur on exam at that time; she did not go back for 6 month f/u; her  mother had heart problems Now she's thinking that's a possibility She is supposed to have B12 shots She has high cholesterol going to see specialist She gets a sharp pain behind her right breast; comes suddenly; goes away just as suddenly; going on for a long time; saw cardiologist, Dr. Ubaldo Glassing but did not go back  Depression screen [facility] 2/9 03/05/2017 02/02/2017 11/02/2016 08/01/2016 01/31/2016  Decreased Interest 0 0 0 0 0  Down, Depressed, Hopeless 0 0 0 1 0  PHQ - 2 Score 0 0 0 1 0  Altered sleeping - - - - -  Tired, decreased energy - - - - -  Change in appetite - - - - -  Feeling bad or failure about yourself  - - - - -  Trouble concentrating - - - - -  Moving slowly or fidgety/restless - - - - -  Suicidal thoughts - - - - -  PHQ-9 Score - - - - -  Difficult doing work/chores - - - - -    Relevant past medical, surgical, family and social history reviewed Past Medical History:  Diagnosis Date  . Angular cheilitis   . Anxiety   . Carotid atherosclerosis   .  Carotid bruit   . Depression   . Elevated blood pressure   . Frequency   . Gross hematuria   . Hyperlipidemia   . Nocturia   . Pyuria   . Sigmoid diverticulosis Oct. 2015   CT scan  . Vitamin B12 deficiency   . Vitamin D deficiency disease    Past Surgical History:  Procedure Laterality Date  . ABDOMINAL HYSTERECTOMY  1987  . APPENDECTOMY  1960  . CARPAL TUNNEL RELEASE Left 1988  . EXCISION NEUROMA Left 1988  . TONSILLECTOMY  1962   Family History  Problem Relation Age of Onset  . Hypertension Mother   . Heart disease Mother   . Clotting disorder Mother   . Other Sister        muscle disease  . ALS Father   . Urolithiasis Father   . Thyroid disease Father   . Thyroid disease Sister   . Clotting disorder Sister   . Cancer Brother 50       lung, brain cancer  . Diabetes Maternal Grandmother   . Stroke Brother    Social History   Tobacco Use  . Smoking status: Never Smoker  . Smokeless tobacco:  Never Used  Substance Use Topics  . Alcohol use: Yes    Comment: occasional wine  . Drug use: No    Interim medical history since last visit reviewed. Allergies and medications reviewed  Review of Systems Per HPI unless specifically indicated above     Objective:    BP 124/84 (BP Location: Left Arm, Patient Position: Sitting, Cuff Size: Large)   Pulse 96   Temp 98.5 F (36.9 C) (Oral)   Ht 4\' 11"  (1.499 m)   Wt 152 lb 11.2 oz (69.3 kg)   SpO2 99%   BMI 30.84 kg/m   Wt Readings from Last 3 Encounters:  03/05/17 152 lb 11.2 oz (69.3 kg)  02/02/17 151 lb 11.2 oz (68.8 kg)  11/02/16 146 lb 4.8 oz (66.4 kg)    Physical Exam  Constitutional: She appears well-developed and well-nourished. No distress.  HENT:  Mouth/Throat: Mucous membranes are normal. She has dentures.  Eyes: EOM are normal. No scleral icterus.  Neck: Carotid bruit is not present. No thyromegaly present.  Cardiovascular: Normal rate and regular rhythm.  Murmur heard.  Systolic murmur is present with a grade of 2/6. Pulmonary/Chest: Effort normal and breath sounds normal. She has no wheezes.  Abdominal: Soft. Bowel sounds are normal. She exhibits no distension.  Musculoskeletal: She exhibits no edema.  Neurological: She is alert.  Skin: She is not diaphoretic. No pallor.  Psychiatric: She has a normal mood and affect. Her behavior is normal. Judgment and thought content normal. Her mood appears not anxious. She does not exhibit a depressed mood.  Good eye contact with examiner   Results for orders placed or performed in visit on 02/02/17  TSH  Result Value Ref Range   TSH 2.24 0.40 - 4.50 mIU/L  Lipid panel  Result Value Ref Range   Cholesterol 253 (H) <200 mg/dL   HDL 57 >50 mg/dL   Triglycerides 162 (H) <150 mg/dL   LDL Cholesterol (Calc) 165 (H) mg/dL (calc)   Total CHOL/HDL Ratio 4.4 <5.0 (calc)   Non-HDL Cholesterol (Calc) 196 (H) <130 mg/dL (calc)  COMPLETE METABOLIC PANEL WITH GFR  Result  Value Ref Range   Glucose, Bld 108 (H) 65 - 99 mg/dL   BUN 14 7 - 25 mg/dL   Creat 0.93 0.50 -  0.99 mg/dL   GFR, Est Non African American 63 > OR = 60 mL/min/1.54m2   GFR, Est African American 73 > OR = 60 mL/min/1.36m2   BUN/Creatinine Ratio NOT APPLICABLE 6 - 22 (calc)   Sodium 140 135 - 146 mmol/L   Potassium 4.5 3.5 - 5.3 mmol/L   Chloride 106 98 - 110 mmol/L   CO2 25 20 - 32 mmol/L   Calcium 9.4 8.6 - 10.4 mg/dL   Total Protein 6.5 6.1 - 8.1 g/dL   Albumin 4.0 3.6 - 5.1 g/dL   Globulin 2.5 1.9 - 3.7 g/dL (calc)   AG Ratio 1.6 1.0 - 2.5 (calc)   Total Bilirubin 0.5 0.2 - 1.2 mg/dL   Alkaline phosphatase (APISO) 87 33 - 130 U/L   AST 22 10 - 35 U/L   ALT 19 6 - 29 U/L  B12  Result Value Ref Range   Vitamin B-12 353 200 - 1,100 pg/mL      Assessment & Plan:   Problem List Items Addressed This Visit      Other   Postmenopausal   Relevant Orders   DG Bone Density   Cardiac murmur - Primary    Will get echocardiogram; avoid excessive straining, exertion      Relevant Orders   ECHOCARDIOGRAM COMPLETE   Ambulatory referral to Cardiology    Other Visit Diagnoses    Encounter for screening mammogram for breast cancer       Relevant Orders   MM DIGITAL SCREENING BILATERAL   Dizziness       Relevant Orders   ECHOCARDIOGRAM COMPLETE   Ambulatory referral to Cardiology   Vitamin B 12 deficiency       Relevant Medications   cyanocobalamin ((VITAMIN B-12)) injection 1,000 mcg (Completed)       Follow up plan: Return in about 1 month (around 04/05/2017) for B12 injection with CMA.  An after-visit summary was printed and given to the patient at [place].  Please see the patient instructions which may contain other information and recommendations beyond what is mentioned above in the assessment and plan.  Meds ordered this encounter  Medications  . cyanocobalamin ((VITAMIN B-12)) injection 1,000 mcg    Orders Placed This Encounter  Procedures  . MM DIGITAL  SCREENING BILATERAL  . DG Bone Density  . Ambulatory referral to Cardiology  . ECHOCARDIOGRAM COMPLETE

## 2017-03-05 NOTE — Assessment & Plan Note (Signed)
Will get echocardiogram; avoid excessive straining, exertion

## 2017-03-19 ENCOUNTER — Ambulatory Visit
Admission: RE | Admit: 2017-03-19 | Discharge: 2017-03-19 | Disposition: A | Discharge status: Home / Self Care | Payer: Medicare HMO | Source: Ambulatory Visit | Attending: Family Medicine | Admitting: Family Medicine

## 2017-03-19 DIAGNOSIS — R011 Cardiac murmur, unspecified: Secondary | ICD-10-CM | POA: Diagnosis not present

## 2017-03-19 DIAGNOSIS — R42 Dizziness and giddiness: Secondary | ICD-10-CM | POA: Insufficient documentation

## 2017-03-19 NOTE — Progress Notes (Signed)
*  PRELIMINARY RESULTS* Echocardiogram 2D Echocardiogram has been performed.  Sarah Gillespie 03/19/2017, 11:29 AM

## 2017-03-20 ENCOUNTER — Encounter: Payer: Self-pay | Admitting: Family Medicine

## 2017-03-20 DIAGNOSIS — I519 Heart disease, unspecified: Secondary | ICD-10-CM | POA: Insufficient documentation

## 2017-03-20 DIAGNOSIS — I5189 Other ill-defined heart diseases: Secondary | ICD-10-CM | POA: Insufficient documentation

## 2017-03-20 DIAGNOSIS — I517 Cardiomegaly: Secondary | ICD-10-CM | POA: Insufficient documentation

## 2017-03-20 HISTORY — DX: Other ill-defined heart diseases: I51.89

## 2017-04-05 ENCOUNTER — Encounter: Payer: Self-pay | Admitting: Family Medicine

## 2017-04-05 ENCOUNTER — Ambulatory Visit (INDEPENDENT_AMBULATORY_CARE_PROVIDER_SITE_OTHER): Payer: Medicare HMO | Admitting: Family Medicine

## 2017-04-05 VITALS — BP 140/90 | HR 97 | Temp 97.6°F | Resp 14 | Ht 59.0 in | Wt 148.1 lb

## 2017-04-05 DIAGNOSIS — Z0000 Encounter for general adult medical examination without abnormal findings: Secondary | ICD-10-CM | POA: Diagnosis not present

## 2017-04-05 DIAGNOSIS — Z1239 Encounter for other screening for malignant neoplasm of breast: Secondary | ICD-10-CM

## 2017-04-05 DIAGNOSIS — Z66 Do not resuscitate: Secondary | ICD-10-CM

## 2017-04-05 DIAGNOSIS — Z1231 Encounter for screening mammogram for malignant neoplasm of breast: Secondary | ICD-10-CM | POA: Diagnosis not present

## 2017-04-05 NOTE — Progress Notes (Signed)
Patient: Sarah Gillespie, Female    DOB: [birth year], 67 y.o.   MRN: [id number]  Visit Date: 04/05/2017  Today's Provider: Enid Derry, MD   Chief Complaint  Patient presents with  . Medicare Wellness    Subjective:   Sarah Gillespie is a 67 y.o. female who presents today for her Subsequent Annual Wellness Visit.  USPSTF grade A and B recommendations Depression:  Depression screen [facility] 2/9 04/05/2017 03/05/2017 02/02/2017 11/02/2016 08/01/2016  Decreased Interest 0 0 0 0 0  Down, Depressed, Hopeless 0 0 0 0 1  PHQ - 2 Score 0 0 0 0 1  Altered sleeping - - - - -  Tired, decreased energy - - - - -  Change in appetite - - - - -  Feeling bad or failure about yourself  - - - - -  Trouble concentrating - - - - -  Moving slowly or fidgety/restless - - - - -  Suicidal thoughts - - - - -  PHQ-9 Score - - - - -  Difficult doing work/chores - - - - -   Hypertension: checked twice; nothing especially salty yesterday, but did have processed meat and fries yesterday; trying to limit salt BP Readings from Last 3 Encounters:  04/05/17 140/90  03/05/17 124/84  02/02/17 124/66   Obesity: Wt Readings from Last 3 Encounters:  04/05/17 148 lb 1.6 oz (67.2 kg)  03/05/17 152 lb 11.2 oz (69.3 kg)  02/02/17 151 lb 11.2 oz (68.8 kg)   BMI Readings from Last 3 Encounters:  04/05/17 29.91 kg/m  03/05/17 30.84 kg/m  02/02/17 30.64 kg/m    Skin cancer: no worrisome moles Lung cancer:  Never smoker Breast cancer: already ordered Colorectal cancer: offered cologuard Cervical cancer screening: s/p hyst HIV, hep B, hep C: already done STD testing and prevention (chl/gon/syphilis): not interested Intimate partner violence: no abuse Contraception: n/a Osteoporosis: ordered DEXA Fall prevention/vitamin D: discussed; makes daughter change lightbulbs Diet: getting better, hard to make meals living alone Exercise: nothing regular; but joining [employer] for silver sneakers Alcohol: wine or beer  occasionally Tobacco use: never Aspirin: taking aspirin; no bleeding Lipids: see someone in [place] for that, referred Lab Results  Component Value Date   CHOL 253 (H) 02/02/2017   CHOL 159 08/01/2016   CHOL 268 (H) 01/31/2016   Lab Results  Component Value Date   HDL 57 02/02/2017   HDL 52 08/01/2016   HDL 57 01/31/2016   Lab Results  Component Value Date   LDLCALC 92 08/01/2016   LDLCALC 188 (H) 01/31/2016   LDLCALC 99 08/09/2015   Lab Results  Component Value Date   TRIG 162 (H) 02/02/2017   TRIG 75 08/01/2016   TRIG 116 01/31/2016   Lab Results  Component Value Date   CHOLHDL 4.4 02/02/2017   CHOLHDL 3.1 08/01/2016   CHOLHDL 4.7 01/31/2016   No results found for: LDLDIRECT Glucose:  Glucose, Bld  Date Value Ref Range Status  02/02/2017 108 (H) 65 - 99 mg/dL Final    Comment:    .            Fasting reference interval . For someone without known diabetes, a glucose value between 100 and 125 mg/dL is consistent with prediabetes and should be confirmed with a follow-up test. .   08/01/2016 97 65 - 99 mg/dL Final  01/31/2016 88 65 - 99 mg/dL Final    Review of Systems  Past Medical History:  Diagnosis Date  . Angular cheilitis   .  Anxiety   . Carotid atherosclerosis   . Carotid bruit   . Depression   . Elevated blood pressure   . Frequency   . Gross hematuria   . Hyperlipidemia   . Nocturia   . Pyuria   . Sigmoid diverticulosis Oct. 2015   CT scan  . Vitamin B12 deficiency   . Vitamin D deficiency disease     Past Surgical History:  Procedure Laterality Date  . ABDOMINAL HYSTERECTOMY  1987  . APPENDECTOMY  1960  . CARPAL TUNNEL RELEASE Left 1988  . EXCISION NEUROMA Left 1988  . TONSILLECTOMY  1962    Family History  Problem Relation Age of Onset  . Hypertension Mother   . Heart disease Mother        CHF for months, aortic stenosis for years  . Clotting disorder Mother   . Aortic stenosis Mother   . Heart failure Mother   .  Depression Mother   . Deep vein thrombosis Mother   . Other Sister        muscle disease, polymyositis  . Heart failure Sister   . Pneumonia Sister        cause of death  . ALS Father   . Urolithiasis Father   . Thyroid disease Father   . CVA Father   . Thyroid disease Sister   . Clotting disorder Sister   . Hyperlipidemia Sister   . Migraines Sister   . Deep vein thrombosis Sister   . Cancer Brother 50       lung, brain cancer  . Diabetes Maternal Grandmother   . Stroke Brother   . Heart murmur Brother        rheumatic fever    Social History   Tobacco Use  . Smoking status: Never Smoker  . Smokeless tobacco: Never Used  Substance Use Topics  . Alcohol use: Yes    Comment: occasional wine  . Drug use: No    Outpatient Encounter Medications as of 04/05/2017  Medication Sig  . aspirin EC 81 MG tablet Take 81 mg by mouth daily.  Marland Kitchen atorvastatin (LIPITOR) 80 MG tablet Take 1 tablet (80 mg total) by mouth daily.  Marland Kitchen CALCIUM PO Take 500 mg by mouth daily.   . Cholecalciferol (VITAMIN D) 2000 UNITS CAPS Take 2,000 Units by mouth daily.  Marland Kitchen lisinopril (PRINIVIL,ZESTRIL) 10 MG tablet Take 1 tablet (10 mg total) by mouth daily. For blood pressure  . venlafaxine XR (EFFEXOR-XR) 37.5 MG 24 hr capsule TAKE ONE CAPSULE BY MOUTH DAILY WITH BREAKFAST  . vitamin B-12 (CYANOCOBALAMIN) 1000 MCG tablet Take 1 tablet by mouth daily.   No facility-administered encounter medications on file as of 04/05/2017.     Functional Ability / Safety Screening 1.  Was the timed Get Up and Go test less than 12 seconds?  yes 2.  Does the patient need help with the phone, transportation, shopping,      preparing meals, housework, laundry, medications, or managing money?  no 3.  Does the patient's home have:  loose throw rugs in the hallway?   no      Grab bars in the bathroom? no      Handrails on the stairs?   yes      Good lighting?   yes 4.  Has the patient noticed any hearing difficulties?    no  Advanced Directives, paperwork given, she may have signed and return to keep on the chart Does patient have a HCPOA?  no If yes, name and contact information: daughter, Sarah Gillespie will be her contact, [phone] Does patient have a living will or MOST form?  no Patient does not want CPR, chest compressions, intubation; if it's her time, it's her time  Immunizations: new shingles vaccine; pneumonia vaccine and flu shot UTD  Fall Risk Assessment See under rooming  6CIT Screen 04/05/2017  What Year? 0 points  What month? 0 points  What time? 0 points  Count back from 20 0 points  Months in reverse 0 points  Repeat phrase 2 points  Total Score 2   Depression Screen See under rooming Depression screen [facility] 2/9 04/05/2017 03/05/2017 02/02/2017 11/02/2016 08/01/2016  Decreased Interest 0 0 0 0 0  Down, Depressed, Hopeless 0 0 0 0 1  PHQ - 2 Score 0 0 0 0 1  Altered sleeping - - - - -  Tired, decreased energy - - - - -  Change in appetite - - - - -  Feeling bad or failure about yourself  - - - - -  Trouble concentrating - - - - -  Moving slowly or fidgety/restless - - - - -  Suicidal thoughts - - - - -  PHQ-9 Score - - - - -  Difficult doing work/chores - - - - -    Objective:   Vitals: BP 140/90   Pulse 97   Temp 97.6 F (36.4 C)   Resp 14   Ht 4\' 11"  (1.499 m)   Wt 148 lb 1.6 oz (67.2 kg)   SpO2 97%   BMI 29.91 kg/m  Body mass index is 29.91 kg/m. No exam data present  Physical Exam Mood/affect:  Euthymic, pleasant Appearance:  Casually dressed, neat hygiene  6CIT Screen 04/05/2017  What Year? 0 points  What month? 0 points  What time? 0 points  Count back from 20 0 points  Months in reverse 0 points  Repeat phrase 2 points  Total Score 2    Assessment & Plan:     Annual Wellness Visit  Reviewed patient's Family Medical History Reviewed and updated list of patient's medical providers Assessment of cognitive impairment was done Assessed  patient's functional ability Established a written schedule for health screening [place] Completed and Reviewed  Exercise Activities and Dietary recommendations Goals    . DIET - EAT MORE FRUITS AND VEGETABLES    . Exercise 150 min/wk Moderate Activity    . Reduce fat intake     Limit saturated fats       Immunization History  Administered Date(s) Administered  . Influenza, High Dose Seasonal PF 11/04/2015, 11/02/2016  . Influenza,inj,Quad PF,6+ Mos 12/03/2014  . Pneumococcal Conjugate-13 12/03/2014  . Pneumococcal Polysaccharide-23 02/02/2017  . Zoster 05/22/2013    Health Maintenance  Topic Date Due  . MAMMOGRAM  09/03/1966  . DEXA SCAN  09/02/2013  . COLONOSCOPY  02/02/2018 (Originally 09/03/1998)  . TETANUS/TDAP  02/02/2018 (Originally 09/03/1967)  . INFLUENZA VACCINE  Completed  . Hepatitis C Screening  Completed  . PNA vac Low Risk Adult  Completed    Discussed health benefits of physical activity, and encouraged her to engage in regular exercise appropriate for her age and condition.   No orders of the defined types were placed in this encounter.   Current Outpatient Medications:  .  aspirin EC 81 MG tablet, Take 81 mg by mouth daily., Disp: , Rfl:  .  atorvastatin (LIPITOR) 80 MG tablet, Take 1 tablet (80 mg  total) by mouth daily., Disp: 90 tablet, Rfl: 3 .  CALCIUM PO, Take 500 mg by mouth daily. , Disp: , Rfl:  .  Cholecalciferol (VITAMIN D) 2000 UNITS CAPS, Take 2,000 Units by mouth daily., Disp: , Rfl:  .  lisinopril (PRINIVIL,ZESTRIL) 10 MG tablet, Take 1 tablet (10 mg total) by mouth daily. For blood pressure, Disp: 90 tablet, Rfl: 3 .  venlafaxine XR (EFFEXOR-XR) 37.5 MG 24 hr capsule, TAKE ONE CAPSULE BY MOUTH DAILY WITH BREAKFAST, Disp: 30 capsule, Rfl: 11 .  vitamin B-12 (CYANOCOBALAMIN) 1000 MCG tablet, Take 1 tablet by mouth daily., Disp: , Rfl:  There are no discontinued medications.  Next Medicare Wellness Visit in 12+  months  Problem List Items Addressed This Visit      Other   Medicare annual wellness visit, subsequent - Primary    USPSTF grade A and B recommendations reviewed with patient; age-appropriate recommendations, preventive care, screening tests, etc discussed and encouraged; healthy living encouraged; see AVS for patient education given to patient       DNR no code (do not resuscitate)    Advanced care planning discussed with patient today; forms given; she will talk with her daughter; bring forms back when completed; reviewed resuscitation efforts, and she verbally expresses her wish for a natural death; she does NOT want any resuscitative efforts in the event of collapse, no compressions, no intubations, no defibrillation, even if this occurs now in her current state of health; I respect her wishes, clarified her intent, and order DNR today      Relevant Orders   DNR (Do Not Resuscitate)   Breast cancer screening    Encouraged pt to get her mammogram; number given, she will call to schedule

## 2017-04-05 NOTE — Assessment & Plan Note (Signed)
USPSTF grade A and B recommendations reviewed with patient; age-appropriate recommendations, preventive care, screening tests, etc discussed and encouraged; healthy living encouraged; see AVS for patient education given to patient  

## 2017-04-05 NOTE — Assessment & Plan Note (Signed)
Encouraged pt to get her mammogram; number given, she will call to schedule

## 2017-04-05 NOTE — Assessment & Plan Note (Signed)
Advanced care planning discussed with patient today; forms given; she will talk with her daughter; bring forms back when completed; reviewed resuscitation efforts, and she verbally expresses her wish for a natural death; she does NOT want any resuscitative efforts in the event of collapse, no compressions, no intubations, no defibrillation, even if this occurs now in her current state of health; I respect her wishes, clarified her intent, and order DNR today

## 2017-04-05 NOTE — Patient Instructions (Addendum)
Expect something from the Colog[employer] people Please do call to schedule your bone density study and mammogram; the number to schedule one at either [facility] or [facility] is [phone] or [phone] Start slow and build up exercise gradually Consider getting the new shingles vaccine called Shingrix; that is available for individuals 69 years of age and older, and is recommended even if you have had shingles in the past and/or already received the old shingles vaccine (Zostavax); it is a two-part series, and is available at many local pharmacies   Health Maintenance, Female Adopting a healthy lifestyle and getting preventive care can go a long way to promote health and wellness. Talk with your health care provider about what schedule of regular examinations is right for you. This is a good chance for you to check in with your provider about disease prevention and staying healthy. In between checkups, there are plenty of things you can do on your own. Experts have done a lot of research about which lifestyle changes and preventive measures are most likely to keep you healthy. Ask your health care provider for more information. Weight and diet Eat a healthy diet  Be sure to include plenty of vegetables, fruits, low-fat dairy products, and lean protein.  Do not eat a lot of foods high in solid fats, added sugars, or salt.  Get regular exercise. This is one of the most important things you can do for your health. ? Most adults should exercise for at least 150 minutes each week. The exercise should increase your heart rate and make you sweat (moderate-intensity exercise). ? Most adults should also do strengthening exercises at least twice a week. This is in addition to the moderate-intensity exercise.  Maintain a healthy weight  Body mass index (BMI) is a measurement that can be used to identify possible weight problems. It estimates body fat based on height and  weight. Your health care provider can help determine your BMI and help you achieve or maintain a healthy weight.  For females 2 years of age and older: ? A BMI below 18.5 is considered underweight. ? A BMI of 18.5 to 24.9 is normal. ? A BMI of 25 to 29.9 is considered overweight. ? A BMI of 30 and above is considered obese.  Watch levels of cholesterol and blood lipids  You should start having your blood tested for lipids and cholesterol at 69 years of age, then have this test every 5 years.  You may need to have your cholesterol levels checked more often if: ? Your lipid or cholesterol levels are high. ? You are older than 69 years of age. ? You are at high risk for heart disease.  Cancer screening Lung Cancer  Lung cancer screening is recommended for adults 24-60 years old who are at high risk for lung cancer because of a history of smoking.  A yearly low-dose CT scan of the lungs is recommended for people who: ? Currently smoke. ? Have quit within the past 15 years. ? Have at least a 30-pack-year history of smoking. A pack year is smoking an average of one pack of cigarettes a day for 1 year.  Yearly screening should continue until it has been 15 years since you quit.  Yearly screening should stop if you develop a health problem that would prevent you from having lung cancer treatment.  Breast Cancer  Practice breast self-awareness. This means understanding how your breasts normally appear and feel.  It also  means doing regular breast self-exams. Let your health care provider know about any changes, no matter how small.  If you are in your 20s or 30s, you should have a clinical breast exam (CBE) by a health care provider every 1-3 years as part of a regular health exam.  If you are 49 or older, have a CBE every year. Also consider having a breast X-ray (mammogram) every year.  If you have a family history of breast cancer, talk to your health care provider about genetic  screening.  If you are at high risk for breast cancer, talk to your health care provider about having an MRI and a mammogram every year.  Breast cancer gene (BRCA) assessment is recommended for women who have family members with BRCA-related cancers. BRCA-related cancers include: ? Breast. ? Ovarian. ? Tubal. ? Peritoneal cancers.  Results of the assessment will determine the need for genetic counseling and BRCA1 and BRCA2 testing.  Cervical Cancer Your health care provider may recommend that you be screened regularly for cancer of the pelvic organs (ovaries, uterus, and vagina). This screening involves a pelvic examination, including checking for microscopic changes to the surface of your cervix (Pap test). You may be encouraged to have this screening done every 3 years, beginning at age 52.  For women ages 64-65, health care providers may recommend pelvic exams and Pap testing every 3 years, or they may recommend the Pap and pelvic exam, combined with testing for human papilloma virus (HPV), every 5 years. Some types of HPV increase your risk of cervical cancer. Testing for HPV may also be done on women of any age with unclear Pap test results.  Other health care providers may not recommend any screening for nonpregnant women who are considered low risk for pelvic cancer and who do not have symptoms. Ask your health care provider if a screening pelvic exam is right for you.  If you have had past treatment for cervical cancer or a condition that could lead to cancer, you need Pap tests and screening for cancer for at least 20 years after your treatment. If Pap tests have been discontinued, your risk factors (such as having a new sexual partner) need to be reassessed to determine if screening should resume. Some women have medical problems that increase the chance of getting cervical cancer. In these cases, your health care provider may recommend more frequent screening and Pap  tests.  Colorectal Cancer  This type of cancer can be detected and often prevented.  Routine colorectal cancer screening usually begins at 69 years of age and continues through 69 years of age.  Your health care provider may recommend screening at an earlier age if you have risk factors for colon cancer.  Your health care provider may also recommend using home test kits to check for hidden blood in the stool.  A small camera at the end of a tube can be used to examine your colon directly (sigmoidoscopy or colonoscopy). This is done to check for the earliest forms of colorectal cancer.  Routine screening usually begins at age 69.  Direct examination of the colon should be repeated every 5-10 years through 69 years of age. However, you may need to be screened more often if early forms of precancerous polyps or small growths are found.  Skin Cancer  Check your skin from head to toe regularly.  Tell your health care provider about any new moles or changes in moles, especially if there is a change in  a mole's shape or color.  Also tell your health care provider if you have a mole that is larger than the size of a pencil eraser.  Always use sunscreen. Apply sunscreen liberally and repeatedly throughout the day.  Protect yourself by wearing long sleeves, pants, a wide-brimmed hat, and sunglasses whenever you are outside.  Heart disease, diabetes, and high blood pressure  High blood pressure causes heart disease and increases the risk of stroke. High blood pressure is more likely to develop in: ? People who have blood pressure in the high end of the normal range (130-139/85-89 mm Hg). ? People who are overweight or obese. ? People who are African American.  If you are 23-30 years of age, have your blood pressure checked every 3-5 years. If you are 30 years of age or older, have your blood pressure checked every year. You should have your blood pressure measured twice-once when you are at  a hospital or clinic, and once when you are not at a hospital or clinic. Record the average of the two measurements. To check your blood pressure when you are not at a hospital or clinic, you can use: ? An automated blood pressure machine at a pharmacy. ? A home blood pressure monitor.  If you are between 24 years and 59 years old, ask your health care provider if you should take aspirin to prevent strokes.  Have regular diabetes screenings. This involves taking a blood sample to check your fasting blood sugar level. ? If you are at a normal weight and have a low risk for diabetes, have this test once every three years after 69 years of age. ? If you are overweight and have a high risk for diabetes, consider being tested at a younger age or more often. Preventing infection Hepatitis B  If you have a higher risk for hepatitis B, you should be screened for this virus. You are considered at high risk for hepatitis B if: ? You were born in a country where hepatitis B is common. Ask your health care provider which countries are considered high risk. ? Your parents were born in a high-risk country, and you have not been immunized against hepatitis B (hepatitis B vaccine). ? You have HIV or AIDS. ? You use needles to inject street drugs. ? You live with someone who has hepatitis B. ? You have had sex with someone who has hepatitis B. ? You get hemodialysis treatment. ? You take certain medicines for conditions, including cancer, organ transplantation, and autoimmune conditions.  Hepatitis C  Blood testing is recommended for: ? Everyone born from [birth year] through 1965. ? Anyone with known risk factors for hepatitis C.  Sexually transmitted infections (STIs)  You should be screened for sexually transmitted infections (STIs) including gonorrhea and chlamydia if: ? You are sexually active and are younger than 69 years of age. ? You are older than 69 years of age and your health care provider tells  you that you are at risk for this type of infection. ? Your sexual activity has changed since you were last screened and you are at an increased risk for chlamydia or gonorrhea. Ask your health care provider if you are at risk.  If you do not have HIV, but are at risk, it may be recommended that you take a prescription medicine daily to prevent HIV infection. This is called pre-exposure prophylaxis (PrEP). You are considered at risk if: ? You are sexually active and do not regularly use condoms  or know the HIV status of your partner(s). ? You take drugs by injection. ? You are sexually active with a partner who has HIV.  Talk with your health care provider about whether you are at high risk of being infected with HIV. If you choose to begin PrEP, you should first be tested for HIV. You should then be tested every 3 months for as long as you are taking PrEP. Pregnancy  If you are premenopausal and you may become pregnant, ask your health care provider about preconception counseling.  If you may become pregnant, take 400 to 800 micrograms (mcg) of folic acid every day.  If you want to prevent pregnancy, talk to your health care provider about birth control (contraception). Osteoporosis and menopause  Osteoporosis is a disease in which the bones lose minerals and strength with aging. This can result in serious bone fractures. Your risk for osteoporosis can be identified using a bone density scan.  If you are 85 years of age or older, or if you are at risk for osteoporosis and fractures, ask your health care provider if you should be screened.  Ask your health care provider whether you should take a calcium or vitamin D supplement to lower your risk for osteoporosis.  Menopause may have certain physical symptoms and risks.  Hormone replacement therapy may reduce some of these symptoms and risks. Talk to your health care provider about whether hormone replacement therapy is right for  you. Follow these instructions at home:  Schedule regular health, dental, and eye exams.  Stay current with your immunizations.  Do not use any tobacco products including cigarettes, chewing tobacco, or electronic cigarettes.  If you are pregnant, do not drink alcohol.  If you are breastfeeding, limit how much and how often you drink alcohol.  Limit alcohol intake to no more than 1 drink per day for nonpregnant women. One drink equals 12 ounces of beer, 5 ounces of wine, or 1 ounces of hard liquor.  Do not use street drugs.  Do not share needles.  Ask your health care provider for help if you need support or information about quitting drugs.  Tell your health care provider if you often feel depressed.  Tell your health care provider if you have ever been abused or do not feel safe at home. This information is not intended to replace advice given to you by your health care provider. Make sure you discuss any questions you have with your health care provider. Document Released: 09/05/2010 Document Revised: 07/29/2015 Document Reviewed: 11/24/2014 Elsevier Interactive Patient Education  [employer].

## 2017-04-16 ENCOUNTER — Encounter: Payer: Self-pay | Admitting: Endocrinology

## 2017-04-16 ENCOUNTER — Ambulatory Visit: Payer: Medicare HMO | Admitting: Endocrinology

## 2017-04-16 VITALS — BP 136/82 | HR 83 | Wt 150.6 lb

## 2017-04-16 DIAGNOSIS — E782 Mixed hyperlipidemia: Secondary | ICD-10-CM

## 2017-04-16 MED ORDER — ROSUVASTATIN CALCIUM 40 MG PO TABS: 40.0000 mg | ORAL_TABLET | Freq: Every day | ORAL | 11 refills | Status: DC

## 2017-04-16 MED ORDER — EZETIMIBE 10 MG PO TABS: 10.0000 mg | ORAL_TABLET | Freq: Every day | ORAL | 11 refills | Status: DC

## 2017-04-16 NOTE — Progress Notes (Signed)
Subjective:    Patient ID: Sarah Gillespie, female    DOB: 1950/03/18, 67 y.o.   MRN: [id number]  HPI Pt is referred by Dr Sanda Klein, for dyslipidemia.  This was dx'ed in 2016.  she denies h/o pancreatitis, diabetes, oral contraceptives, steroids, alcoholism, HIV, SLE, HCTZ, retin-A, B-blocker, thyroid problems, or kidney disease.  This has been rx'ed with lipitor x 1 year.  She has slight dryness of both eyes, and assoc weight gain.  No clinical ASCVD.   Past Medical History:  Diagnosis Date  . Angular cheilitis   . Anxiety   . Carotid atherosclerosis   . Carotid bruit   . Depression   . Elevated blood pressure   . Frequency   . Gross hematuria   . Hyperlipidemia   . Nocturia   . Pyuria   . Sigmoid diverticulosis Oct. 2015   CT scan  . Vitamin B12 deficiency   . Vitamin D deficiency disease     Past Surgical History:  Procedure Laterality Date  . ABDOMINAL HYSTERECTOMY  1987  . APPENDECTOMY  1960  . CARPAL TUNNEL RELEASE Left 1988  . EXCISION NEUROMA Left 1988  . TONSILLECTOMY  1962    Social History   Socioeconomic History  . Marital status: Divorced    Spouse name: Not on file  . Number of children: Not on file  . Years of education: Not on file  . Highest education level: Not on file  Social Needs  . Financial resource strain: Not on file  . Food insecurity - worry: Not on file  . Food insecurity - inability: Not on file  . Transportation needs - medical: Not on file  . Transportation needs - non-medical: Not on file  Occupational History  . Not on file  Tobacco Use  . Smoking status: Never Smoker  . Smokeless tobacco: Never Used  Substance and Sexual Activity  . Alcohol use: Yes    Comment: occasional wine  . Drug use: No  . Sexual activity: Not Currently  Other Topics Concern  . Not on file  Social History Narrative  . Not on file    Current Outpatient Medications on File Prior to Visit  Medication Sig Dispense Refill  . aspirin EC 81 MG tablet  Take 81 mg by mouth daily.    Marland Kitchen CALCIUM PO Take 500 mg by mouth daily.     . Cholecalciferol (VITAMIN D) 2000 UNITS CAPS Take 2,000 Units by mouth daily.    Marland Kitchen lisinopril (PRINIVIL,ZESTRIL) 10 MG tablet Take 1 tablet (10 mg total) by mouth daily. For blood pressure 90 tablet 3  . venlafaxine XR (EFFEXOR-XR) 37.5 MG 24 hr capsule TAKE ONE CAPSULE BY MOUTH DAILY WITH BREAKFAST 30 capsule 11  . vitamin B-12 (CYANOCOBALAMIN) 1000 MCG tablet Take 1 tablet by mouth daily.     No current facility-administered medications on file prior to visit.     No Known Allergies  Family History  Problem Relation Age of Onset  . Hypertension Mother   . Heart disease Mother        CHF for months, aortic stenosis for years  . Clotting disorder Mother   . Aortic stenosis Mother   . Heart failure Mother   . Depression Mother   . Deep vein thrombosis Mother   . Other Sister        muscle disease, polymyositis  . Heart failure Sister   . Pneumonia Sister        cause of death  .  ALS Father   . Urolithiasis Father   . Thyroid disease Father   . CVA Father   . Thyroid disease Sister   . Clotting disorder Sister   . Hyperlipidemia Sister   . Migraines Sister   . Deep vein thrombosis Sister   . Cancer Brother 50       lung, brain cancer  . Diabetes Maternal Grandmother   . Stroke Brother   . Heart murmur Brother        rheumatic fever    BP 136/82 (BP Location: Left Arm, Patient Position: Sitting, Cuff Size: Normal)   Pulse 83   Wt 150 lb 9.6 oz (68.3 kg)   SpO2 96%   BMI 30.42 kg/m   Review of Systems Denies fatigue, numbness, chest pain, skin rash, dyspnea, neck swelling, abd pain, polyuria,myalgias, and headache. She had a near-syncopal episode in late 2018.  She has cold intolerance and rhinorrhea.  Depression is well-controlled.    Objective:   Physical Exam VS: see vs page GEN: no distress HEAD: head: no deformity eyes: no periorbital swelling, no proptosis external nose and ears  are normal mouth: no lesion seen NECK: supple, thyroid is not enlarged CHEST WALL: no deformity LUNGS: clear to auscultation CV: reg rate and rhythm, no murmur ABD: abdomen is soft, nontender.  no hepatosplenomegaly.  not distended.  no hernia MUSCULOSKELETAL: muscle bulk and strength are grossly normal.  no obvious joint swelling.  gait is normal and steady EXTEMITIES: no deformity.  no edema PULSES: no carotid bruit NEURO:  cn 2-12 grossly intact.   readily moves all 4's.  sensation is intact to touch on all 4's SKIN:  Normal texture and temperature.  No rash or suspicious lesion is visible.  No stigmata of dyslipidemia NODES:  None palpable at the neck PSYCH: alert, well-oriented.  Does not appear anxious nor depressed.    Lab Results  Component Value Date   CHOL 253 (H) 02/02/2017   HDL 57 02/02/2017   LDLCALC 92 08/01/2016   TRIG 162 (H) 02/02/2017   CHOLHDL 4.4 02/02/2017     Lab Results  Component Value Date   TSH 2.24 02/02/2017   RIGHT CAROTID ARTERY: Mild focal heterogeneous but partially calcified atherosclerotic plaque in the carotid bifurcation extending into the proximal internal carotid artery. By peak systolic velocity criteria the estimated stenosis remains less than 50%. No significant progression compared to prior.  RIGHT VERTEBRAL ARTERY:  Patent with normal antegrade flow.  LEFT CAROTID ARTERY: Trace heterogeneous atherosclerotic plaque in the internal carotid artery. By peak systolic velocity criteria the estimated stenosis remains less than 50%.  I have reviewed outside records, and summarized:  Pt was noted to have elevated LDL, and referred here.  She was seen for AWV, and probs were stable.  She tolerates lipitor well.       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  She needs increased rx.  Dry eyes: I told pt I cannot connect this to the above.   Patient Instructions  Please see a dietician specialist.   you will receive a phone call, about a day and time for an appointment. I have sent 2 prescriptions to your pharmacy: to change lipitor to crestor, and to add "zetia." Please redo the blood tests in 1 month, in [place], fasting.  There are other meds we can add if necessary.

## 2017-04-16 NOTE — Patient Instructions (Addendum)
Please see a dietician specialist.  you will receive a phone call, about a day and time for an appointment. I have sent 2 prescriptions to your pharmacy: to change lipitor to crestor, and to add "zetia." Please redo the blood tests in 1 month, in [place], fasting.  There are other meds we can add if necessary.

## 2017-05-14 ENCOUNTER — Telehealth: Payer: Self-pay | Admitting: Endocrinology

## 2017-05-14 DIAGNOSIS — E782 Mixed hyperlipidemia: Secondary | ICD-10-CM

## 2017-05-14 NOTE — Telephone Encounter (Signed)
Please advise on what labs should be put in?

## 2017-05-14 NOTE — Telephone Encounter (Signed)
Patient stated that dr was sending patient over to a [facility] in [place] for blood work, they stated that they do not have anything in for this They need the orders put in .    Please advise

## 2017-05-14 NOTE — Telephone Encounter (Signed)
I ordered.

## 2017-05-14 NOTE — Telephone Encounter (Signed)
I called and notified patient that labs were in. I advised her to call [facility] to make lab appt.

## 2017-05-15 ENCOUNTER — Telehealth: Payer: Self-pay

## 2017-05-15 NOTE — Telephone Encounter (Signed)
Copied from [id number]. Topic: Inquiry >> May 15, 2017 10:45 AM Vernona Rieger wrote: Reason for CRM:  Patient said that Dr Loanne Drilling called her and told her that she needed to contact "[facility]" for a lab appt. Patient said she is confused as she is a [facility] patient. Please call patient [phone]. Patient does have orders in for a lab, just not sure if she needs to be scheduled at [facility] or [facility].  Called pt she states that she is just needing help getting scheduled w/ [facility] for a blood draw. Advised pt that we are not a part of [facility] but I would give them a call and ask that they assist in getting her scheduled.

## 2017-05-17 ENCOUNTER — Other Ambulatory Visit (INDEPENDENT_AMBULATORY_CARE_PROVIDER_SITE_OTHER): Payer: Medicare HMO

## 2017-05-17 DIAGNOSIS — E782 Mixed hyperlipidemia: Secondary | ICD-10-CM

## 2017-05-17 LAB — LIPID PANEL
CHOLESTEROL: 123 mg/dL (ref 0–200)
HDL: 58.1 mg/dL (ref >39.00)
LDL Cholesterol: 51 mg/dL (ref 0–99)
NONHDL: 65.25
TRIGLYCERIDES: 72 mg/dL (ref 0.0–149.0)
Total CHOL/HDL Ratio: 2
VLDL: 14.4 mg/dL (ref 0.0–40.0)

## 2017-05-23 ENCOUNTER — Encounter: Payer: Self-pay | Admitting: Nurse Practitioner

## 2017-05-23 ENCOUNTER — Ambulatory Visit (INDEPENDENT_AMBULATORY_CARE_PROVIDER_SITE_OTHER): Payer: Medicare HMO | Admitting: Nurse Practitioner

## 2017-05-23 VITALS — BP 122/80 | HR 95 | Temp 98.5°F | Resp 16 | Wt 151.1 lb

## 2017-05-23 DIAGNOSIS — R05 Cough: Secondary | ICD-10-CM

## 2017-05-23 DIAGNOSIS — E782 Mixed hyperlipidemia: Secondary | ICD-10-CM | POA: Diagnosis not present

## 2017-05-23 DIAGNOSIS — F3341 Major depressive disorder, recurrent, in partial remission: Secondary | ICD-10-CM

## 2017-05-23 DIAGNOSIS — Z1231 Encounter for screening mammogram for malignant neoplasm of breast: Secondary | ICD-10-CM | POA: Diagnosis not present

## 2017-05-23 DIAGNOSIS — Z1239 Encounter for other screening for malignant neoplasm of breast: Secondary | ICD-10-CM

## 2017-05-23 DIAGNOSIS — R059 Cough, unspecified: Secondary | ICD-10-CM

## 2017-05-23 DIAGNOSIS — J069 Acute upper respiratory infection, unspecified: Secondary | ICD-10-CM

## 2017-05-23 DIAGNOSIS — B001 Herpesviral vesicular dermatitis: Secondary | ICD-10-CM

## 2017-05-23 MED ORDER — GUAIFENESIN 200 MG PO TABS: 200.0000 mg | ORAL_TABLET | ORAL | 0 refills | Status: DC | PRN

## 2017-05-23 MED ORDER — FLUTICASONE PROPIONATE 50 MCG/ACT NA SUSP: 2.0000 | Freq: Every day | NASAL | 6 refills | Status: DC

## 2017-05-23 MED ORDER — BENZONATATE 200 MG PO CAPS: 200.0000 mg | ORAL_CAPSULE | Freq: Two times a day (BID) | ORAL | 0 refills | Status: DC | PRN

## 2017-05-23 NOTE — Progress Notes (Addendum)
Name: Sarah Gillespie   MRN: [id number]    DOB: 1950-08-11   Date:05/23/2017       Progress Note  Subjective  Chief Complaint  Chief Complaint  Patient presents with  . URI    onset 2 days symptoms include cough, cold sore, chest congestion and sneezing  . Mouth Lesions    HPI  URI Patient sts started sneezing and rhinorrhea- clear discharge about 4-5 days ago. Then progressed to dry cough and chest congestion. Endorses mild fever and chills- sts not sure if its because of the weather. Denies sore throat, headaches, body aches, n/v/d, denies visual changes, eye drainage, sinus pain, ear fullness/pain. Endorses chest soreness with cough. Denies shortness of breath or wheezing. Denies sick contacts. Patient has tried dayquil, nyquil and tylenol but cough is getting worse.  Lip Sore  Pt noticed lip sore 4 days ago. Patient put warm compress on lip sore which burst and bleed and swelling reduced. Patient had it previously but has not had it for over 5 years, and they self-resolved.   Hyperlipidemia Saw Dr Loanne Drilling who changed cholesterol medication to crestor and added zetia. Patient has been compliant medication no SE. Labs redrawn last week- and look great! Lab Results  Component Value Date   CHOL 123 05/17/2017   HDL 58.10 05/17/2017   LDLCALC 51 05/17/2017   TRIG 72.0 05/17/2017   CHOLHDL 2 05/17/2017    Depression Effexor working well- no SE. Working well; manages symptoms well- does not want/need change of dose.   Need for Mammogram and DEXA Scan sts she has the information; just forgot to call   Patient Active Problem List   Diagnosis Date Noted  . DNR no code (do not resuscitate) 04/05/2017  . Mild concentric left ventricular hypertrophy (LVH) 03/20/2017  . Grade I diastolic dysfunction 88/41/6606  . Cardiac murmur 03/05/2017  . Need for 23-polyvalent pneumococcal polysaccharide vaccine 02/02/2017  . Chronic kidney disease (CKD), stage III (moderate) ([place]) 11/02/2016   . Knee sprain and strain 11/08/2015  . Sciatica 10/08/2015  . Medicare annual wellness visit, subsequent 07/30/2015  . Breast cancer screening 12/16/2014  . Postmenopausal 12/16/2014  . Trochanteric bursitis of both hips 12/07/2014  . Vaccine for streptococcus pneumoniae and influenza 12/03/2014  . Hyperlipidemia   . Depression   . Vitamin B12 deficiency   . Vitamin D deficiency disease   . Carotid atherosclerosis   . Essential hypertension, benign   . Major depressive disorder, recurrent episode, moderate ([place]) 12/10/2013  . Other chest pain 12/10/2013  . Sigmoid diverticulosis 12/04/2013    Social History   Tobacco Use  . Smoking status: Never Smoker  . Smokeless tobacco: Never Used  Substance Use Topics  . Alcohol use: Yes    Comment: occasional wine     Current Outpatient Medications:  .  aspirin EC 81 MG tablet, Take 81 mg by mouth daily., Disp: , Rfl:  .  CALCIUM PO, Take 500 mg by mouth daily. , Disp: , Rfl:  .  Cholecalciferol (VITAMIN D) 2000 UNITS CAPS, Take 2,000 Units by mouth daily., Disp: , Rfl:  .  ezetimibe (ZETIA) 10 MG tablet, Take 1 tablet (10 mg total) by mouth daily., Disp: 30 tablet, Rfl: 11 .  lisinopril (PRINIVIL,ZESTRIL) 10 MG tablet, Take 1 tablet (10 mg total) by mouth daily. For blood pressure, Disp: 90 tablet, Rfl: 3 .  rosuvastatin (CRESTOR) 40 MG tablet, Take 1 tablet (40 mg total) by mouth daily., Disp: 30 tablet, Rfl: 11 .  venlafaxine XR (EFFEXOR-XR) 37.5 MG 24 hr capsule, TAKE ONE CAPSULE BY MOUTH DAILY WITH BREAKFAST, Disp: 30 capsule, Rfl: 11 .  vitamin B-12 (CYANOCOBALAMIN) 1000 MCG tablet, Take 1 tablet by mouth daily., Disp: , Rfl:   No Known Allergies  ROS  Constitutional: Negative for fever or weight change.  Respiratory: Positive for cough and Negative shortness of breath.   Cardiovascular: Negative for chest pain or palpitations.  Gastrointestinal: Negative for abdominal pain, no bowel changes.  Musculoskeletal: Negative for  gait problem or joint swelling.  Skin: Positive lip sore, Negative for rash.  Neurological: Negative for dizziness or headache.  No other specific complaints in a complete review of systems (except as listed in HPI above).  Objective  Vitals:   05/23/17 1131  BP: 122/80  Pulse: 95  Resp: 16  Temp: 98.5 F (36.9 C)  TempSrc: Oral  SpO2: 94%  Weight: 151 lb 1.6 oz (68.5 kg)    Body mass index is 30.52 kg/m.  Nursing Note and Vital Signs reviewed.  Physical Exam   Constitutional: Patient appears well-developed and well-nourished.No distress.  HEENT: head atraumatic, normocephalic, pupils equal and reactive to light,TM's without erythema or bulging, nasal turbinates with mild swelling- no nasal drainage, no maxillary or frontal sinus tenderness , neck supple without lymphadenopathy, oropharynx pink and moist without exudate, small red area, non-tender above lip.  Cardiovascular: Normal rate, regular rhythm, S1/S2 present.  No murmur or rub heard. No BLE edema. Pulmonary/Chest: Effort normal and breath sounds clear. No respiratory distress or retractions. Abdominal: Soft and non-tender, bowel sounds present. Psychiatric: Patient has a normal mood and affect. behavior is normal. Judgment and thought content normal.  No results found for this or any previous visit (from the past 72 hour(s)).  Assessment & Plan  1. Cough  - benzonatate (TESSALON) 200 MG capsule; Take 1 capsule (200 mg total) by mouth 2 (two) times daily as needed for cough.  Dispense: 20 capsule; Refill: 0  2. Viral upper respiratory tract infection  - rest, drink lots of water, continue tylenol, nyquil as needed - benzonatate (TESSALON) 200 MG capsule; Take 1 capsule (200 mg total) by mouth 2 (two) times daily as needed for cough.  Dispense: 20 capsule; Refill: 0 - fluticasone (FLONASE) 50 MCG/ACT nasal spray; Place 2 sprays into both nostrils daily.  Dispense: 16 g; Refill: 6 - guaiFENesin 200 MG tablet; Take  1 tablet (200 mg total) by mouth every 4 (four) hours as needed for cough or to loosen phlegm.  Dispense: 30 suppository; Refill: 0  3. Recurrent major depressive disorder, in partial remission (HCC) - Continue Effexor at current dosage.   4. Herpes simplex labialis - typically self-limiting; discussed OTC Abreva vs Rx- pt sts will try OTC - Follow up with derm as needed   5. Breast cancer screening - Order already placed, pt informed and given number  6. Mixed hyperlipidemia - Praise given for great improvement - Continue medication regimen    -Red flags and when to present for emergency care or RTC including fever >101.42F, chest pain, shortness of breath, new/worsening/un-resolving symptomsreviewed with patient at time of visit. Follow up and care instructions discussed and provided in AVS. -Reviewed Health Maintenance: discussed mammo and dexa  I have reviewed this encounter including the documentation in this note and/or discussed this patient with the provider, Suezanne Cheshire, AGNP-C. I am certifying that I agree with the content of this note as supervising physician.  Enid Derry, MD [facility] 05/23/2017, 2:44 PM

## 2017-05-23 NOTE — Patient Instructions (Signed)
  Call  [phone] for you mammogram and DEXA Scan  - Take tessalon pearls for coughing - You can use lozenges, honey and tea to soothe your throat and hepl reduce coughing - take flonase and musinex to help with your nasal symptoms - If you feel signicantly worse in the next few days come back in to be re-evaluated - Your lip sores should resolve on their own: you can take Over the Counter Abreva I you would like and if that does not resolve we can try a prescription strenghth medicaiton or send you to a dermatologist   You likely have a viral upper respiratory infection (URI). Antibiotics will not reduce the number of days you are ill or prevent you from getting bacterial rhinosinusitis. A URI can take up to 14 days to resolve, but typically last between 7-11 days. Your body is so smart and strong that it will be fighting this illness off for you but it is important that you drink plenty of fluids, rest. Cover your nose/mouth when you cough or sneeze and wash your hands well and often. Here are some helpful things you can use or pick up over the counter from the pharmacy to help with your symptoms:   For Fever/Pain: Acetaminophen every 6 hours as needed (maximum of 3000mg  a day). If you are still uncomfortable you can add ibuprofen OR naproxen  For coughing: try dextromethorphan for a cough suppressant, and/or a cool mist humidifier, lozenges  For sore throat: saline gargles, honey herbal tea, lozenges, throat spray  To dry out your nose: try an antihistamine like loratadine (non-sedating) or diphenhydramine (sedating) or others To relieve a stuffy nose: try an oral decongestant  Like pseudoephedrine if you are under the age of 61 and do not have high blood pressure, neti pot To make blowing your nose easier: guaifenesin

## 2017-05-29 ENCOUNTER — Other Ambulatory Visit: Payer: Self-pay | Admitting: Nurse Practitioner

## 2017-05-29 ENCOUNTER — Telehealth: Payer: Self-pay | Admitting: Family Medicine

## 2017-05-29 DIAGNOSIS — R05 Cough: Secondary | ICD-10-CM

## 2017-05-29 DIAGNOSIS — R059 Cough, unspecified: Secondary | ICD-10-CM

## 2017-05-29 NOTE — Telephone Encounter (Signed)
Copied from [place] [id number]. Topic: Quick Communication - See Telephone Encounter >> May 29, 2017 12:53 PM Neva Seat wrote: Pt is feeling worse from visit on Wed of last week.  Pt is having severe coughing. Sore all over from the coughing. She wants to know if there is something that can be prescribed to help her. Please call pt to discuss.

## 2017-05-29 NOTE — Telephone Encounter (Signed)
Pt saw you last, please see below and advise.

## 2017-05-30 MED ORDER — PROMETHAZINE-DM 6.25-15 MG/5ML PO SYRP: 2.5000 mL | ORAL_SOLUTION | Freq: Four times a day (QID) | ORAL | 0 refills | Status: DC | PRN

## 2017-05-30 NOTE — Telephone Encounter (Signed)
Pt.notified

## 2017-05-30 NOTE — Telephone Encounter (Signed)
Called pt she states her cough is worse and is making her gage and creating back/rib/chest pain from coughing so hard.  She is taking the Flonase and Benzonate with out any relief.  She did not get the Mucinez and does not know which one to get 24hr the DM? Please advise?  Also needs some type of releif is there anything else she can do or try med wise?

## 2017-05-31 ENCOUNTER — Ambulatory Visit
Admission: RE | Admit: 2017-05-31 | Discharge: 2017-05-31 | Disposition: A | Discharge status: Home / Self Care | Payer: Medicare HMO | Source: Ambulatory Visit | Attending: Nurse Practitioner | Admitting: Nurse Practitioner

## 2017-05-31 DIAGNOSIS — R0602 Shortness of breath: Secondary | ICD-10-CM | POA: Diagnosis not present

## 2017-05-31 DIAGNOSIS — I517 Cardiomegaly: Secondary | ICD-10-CM | POA: Insufficient documentation

## 2017-05-31 DIAGNOSIS — R079 Chest pain, unspecified: Secondary | ICD-10-CM | POA: Diagnosis not present

## 2017-05-31 DIAGNOSIS — R05 Cough: Secondary | ICD-10-CM

## 2017-05-31 DIAGNOSIS — R059 Cough, unspecified: Secondary | ICD-10-CM

## 2017-06-01 ENCOUNTER — Telehealth: Payer: Self-pay

## 2017-06-01 ENCOUNTER — Other Ambulatory Visit: Payer: Self-pay | Admitting: Nurse Practitioner

## 2017-06-01 DIAGNOSIS — R05 Cough: Secondary | ICD-10-CM

## 2017-06-01 DIAGNOSIS — R059 Cough, unspecified: Secondary | ICD-10-CM

## 2017-06-01 MED ORDER — DOXYCYCLINE HYCLATE 100 MG PO TABS: 100.0000 mg | ORAL_TABLET | Freq: Two times a day (BID) | ORAL | 0 refills | Status: DC

## 2017-06-01 NOTE — Telephone Encounter (Signed)
Copied from [place] [id number]. Topic: Appointment Scheduling - Same Day Appointment >> Jun 01, 2017  8:43 AM Ether Griffins B wrote: Patient called to schedule an appointment for TODAY with Dr. Sanda Klein. Pt can barely talk and has horrible cough.

## 2017-06-01 NOTE — Telephone Encounter (Signed)
Pt.notified

## 2017-06-01 NOTE — Telephone Encounter (Signed)
Please advise neither of yall have appts?  Pt was seen on 3/20 by elizabeth and got xray yesterday which was normal?

## 2017-07-05 DIAGNOSIS — H1031 Unspecified acute conjunctivitis, right eye: Secondary | ICD-10-CM | POA: Diagnosis not present

## 2017-07-05 DIAGNOSIS — H02843 Edema of right eye, unspecified eyelid: Secondary | ICD-10-CM | POA: Diagnosis not present

## 2017-08-03 ENCOUNTER — Ambulatory Visit (INDEPENDENT_AMBULATORY_CARE_PROVIDER_SITE_OTHER): Payer: Medicare HMO | Admitting: Family Medicine

## 2017-08-03 ENCOUNTER — Encounter: Payer: Self-pay | Admitting: Family Medicine

## 2017-08-03 VITALS — BP 118/78 | HR 96 | Temp 98.1°F | Resp 12 | Ht 59.0 in | Wt 146.0 lb

## 2017-08-03 DIAGNOSIS — N182 Chronic kidney disease, stage 2 (mild): Secondary | ICD-10-CM | POA: Diagnosis not present

## 2017-08-03 DIAGNOSIS — E538 Deficiency of other specified B group vitamins: Secondary | ICD-10-CM | POA: Diagnosis not present

## 2017-08-03 DIAGNOSIS — Z5181 Encounter for therapeutic drug level monitoring: Secondary | ICD-10-CM | POA: Diagnosis not present

## 2017-08-03 DIAGNOSIS — E782 Mixed hyperlipidemia: Secondary | ICD-10-CM

## 2017-08-03 DIAGNOSIS — E559 Vitamin D deficiency, unspecified: Secondary | ICD-10-CM | POA: Diagnosis not present

## 2017-08-03 DIAGNOSIS — I6523 Occlusion and stenosis of bilateral carotid arteries: Secondary | ICD-10-CM | POA: Diagnosis not present

## 2017-08-03 DIAGNOSIS — I10 Essential (primary) hypertension: Secondary | ICD-10-CM | POA: Diagnosis not present

## 2017-08-03 DIAGNOSIS — F331 Major depressive disorder, recurrent, moderate: Secondary | ICD-10-CM

## 2017-08-03 DIAGNOSIS — I6521 Occlusion and stenosis of right carotid artery: Secondary | ICD-10-CM

## 2017-08-03 NOTE — Assessment & Plan Note (Signed)
Check level 

## 2017-08-03 NOTE — Assessment & Plan Note (Signed)
Well-controlled today; continue to limit salt

## 2017-08-03 NOTE — Assessment & Plan Note (Signed)
Check level and supplement if needed 

## 2017-08-03 NOTE — Assessment & Plan Note (Signed)
Due for carotid scan in July 2019; LDL is at goal

## 2017-08-03 NOTE — Patient Instructions (Addendum)
Try to use PLAIN allergy medicine without the decongestant Avoid: phenylephrine, phenylpropanolamine, and pseudoephredine  Please call [phone] to schedule your imaging test, carotid ultrasound Please wait 2-3 days after the order has been placed to call and get your test scheduled It is due on or after September 19, 2017  Try to limit saturated fats in your diet (bologna, hot dogs, barbeque, cheeseburgers, hamburgers, steak, bacon, sausage, cheese, etc.) and get more fresh fruits, vegetables, and whole grains  Try to follow the DASH guidelines (DASH stands for Dietary Approaches to Stop Hypertension). Try to limit the sodium in your diet to no more than 1,500mg  of sodium per day. Certainly try to not exceed 2,000 mg per day at the very most. Do not add salt when cooking or at the table.  Check the sodium amount on labels when shopping, and choose items lower in sodium when given a choice. Avoid or limit foods that already contain a lot of sodium. Eat a diet rich in fruits and vegetables and whole grains, and try to lose weight if overweight or obese  Please do return the Cologuard within the next two weeks

## 2017-08-03 NOTE — Assessment & Plan Note (Signed)
Continue medicine 

## 2017-08-03 NOTE — Progress Notes (Signed)
BP 118/78   Pulse 96   Temp 98.1 F (36.7 C) (Oral)   Resp 12   Ht 4\' 11"  (1.499 m)   Wt 146 lb (66.2 kg)   SpO2 95%   BMI 29.49 kg/m    Subjective:    Patient ID: Sarah Gillespie, female    DOB: [birth year], 67 y.o.   MRN: [id number]  HPI: Sarah Gillespie is a 67 y.o. female  Chief Complaint  Patient presents with  . Follow-up    HPI Patient is here for f/u She was really sick with coughing illness 2 months ago or so; no appetite when sick; all finished with the illness and symptoms have resolved completely; her sister had it too; she had pink eye and went to urgent care for treatment; took antibiotic for the pink eye; then had constipation, used stool softerners and all better now  High blood pressure; well-controlled; not checking away from the doctor; not adding much salt except to help water boil; not using decongestants  Depression; taking effexor  Cholesterol; eating a good diet; teeth are fitting better and adhesive is fitting better; fresh produce and salads; taking crestor and zetia; sister has high cholesterol and brother had stroke; no muscle aches on the medicine Lab Results  Component Value Date   CHOL 123 05/17/2017   HDL 58.10 05/17/2017   LDLCALC 51 05/17/2017   TRIG 72.0 05/17/2017   CHOLHDL 2 05/17/2017   CKD stage 2; avoiding NSAIDs; good water drinker  Vitamin B12; less than 400 in November  Depression screen [facility] 2/9 08/03/2017 05/23/2017 04/05/2017 03/05/2017 02/02/2017  Decreased Interest 0 0 0 0 0  Down, Depressed, Hopeless 1 0 0 0 0  PHQ - 2 Score 1 0 0 0 0  Altered sleeping 0 - - - -  Tired, decreased energy 0 - - - -  Change in appetite 0 - - - -  Feeling bad or failure about yourself  0 - - - -  Trouble concentrating 0 - - - -  Moving slowly or fidgety/restless 0 - - - -  Suicidal thoughts 0 - - - -  PHQ-9 Score 1 - - - -  Difficult doing work/chores Not difficult at all - - - -    Relevant past medical, surgical, family and social  history reviewed Past Medical History:  Diagnosis Date  . Angular cheilitis   . Anxiety   . Carotid atherosclerosis   . Carotid bruit   . Depression   . Elevated blood pressure   . Frequency   . Gross hematuria   . Hyperlipidemia   . Nocturia   . Pyuria   . Sigmoid diverticulosis Oct. 2015   CT scan  . Vitamin B12 deficiency   . Vitamin D deficiency disease    Past Surgical History:  Procedure Laterality Date  . ABDOMINAL HYSTERECTOMY  1987  . APPENDECTOMY  1960  . CARPAL TUNNEL RELEASE Left 1988  . EXCISION NEUROMA Left 1988  . TONSILLECTOMY  1962   Family History  Problem Relation Age of Onset  . Hypertension Mother   . Heart disease Mother        CHF for months, aortic stenosis for years  . Clotting disorder Mother   . Aortic stenosis Mother   . Heart failure Mother   . Depression Mother   . Deep vein thrombosis Mother   . Other Sister        muscle disease, polymyositis  . Heart failure Sister   .  Pneumonia Sister        cause of death  . ALS Father   . Urolithiasis Father   . Thyroid disease Father   . CVA Father   . Thyroid disease Sister   . Clotting disorder Sister   . Hyperlipidemia Sister   . Migraines Sister   . Deep vein thrombosis Sister   . Cancer Brother 50       lung, brain cancer  . Diabetes Maternal Grandmother   . Stroke Brother   . Heart murmur Brother        rheumatic fever   Social History   Tobacco Use  . Smoking status: Never Smoker  . Smokeless tobacco: Never Used  Substance Use Topics  . Alcohol use: Yes    Comment: occasional wine  . Drug use: No    Interim medical history since last visit reviewed. Allergies and medications reviewed  Review of Systems Per HPI unless specifically indicated above     Objective:    BP 118/78   Pulse 96   Temp 98.1 F (36.7 C) (Oral)   Resp 12   Ht 4\' 11"  (1.499 m)   Wt 146 lb (66.2 kg)   SpO2 95%   BMI 29.49 kg/m   Wt Readings from Last 3 Encounters:  08/03/17 146 lb  (66.2 kg)  05/23/17 151 lb 1.6 oz (68.5 kg)  04/16/17 150 lb 9.6 oz (68.3 kg)    Physical Exam  Constitutional: She appears well-developed and well-nourished. No distress.  HENT:  Head: Normocephalic and atraumatic.  Eyes: EOM are normal. No scleral icterus.  Neck: Carotid bruit is not present. No thyromegaly present.  Cardiovascular: Normal rate, regular rhythm and normal heart sounds.  No murmur heard. Pulmonary/Chest: Effort normal and breath sounds normal. No respiratory distress. She has no wheezes.  Abdominal: Soft. Bowel sounds are normal. She exhibits no distension.  Musculoskeletal: She exhibits no edema.  Neurological: She is alert. She exhibits normal muscle tone.  Skin: Skin is warm and dry. She is not diaphoretic. No pallor.  Psychiatric: She has a normal mood and affect. Her behavior is normal. Judgment and thought content normal. Her mood appears not anxious. She does not exhibit a depressed mood.    Results for orders placed or performed in visit on 05/17/17  Lipid panel  Result Value Ref Range   Cholesterol 123 0 - 200 mg/dL   Triglycerides 72.0 0.0 - 149.0 mg/dL   HDL 58.10 >39.00 mg/dL   VLDL 14.4 0.0 - 40.0 mg/dL   LDL Cholesterol 51 0 - 99 mg/dL   Total CHOL/HDL Ratio 2    NonHDL 65.25       Assessment & Plan:   Problem List Items Addressed This Visit      Cardiovascular and Mediastinum   Essential hypertension, benign (Chronic)    Well-controlled today; continue to limit salt      Carotid atherosclerosis (Chronic)    Due for carotid scan in July 2019; LDL is at goal        Genitourinary   CKD (chronic kidney disease) stage 2, GFR 60-89 ml/min    Avoid NSAIDs, try to increase hydration        Other   Vitamin D deficiency disease    Check level and supplement if needed      Relevant Orders   VITAMIN D 25 Hydroxy (Vit-D Deficiency, Fractures)   Vitamin B12 deficiency    Check level      Relevant Orders  Vitamin B12   Major  depressive disorder, recurrent episode, moderate (HCC)    Continue medicine      Hyperlipidemia    Managed by endocrinologist; last LDL and HDL reviewed with her; continue meds, try to limit saturated fats       Other Visit Diagnoses    Carotid atherosclerosis, bilateral    -  Primary   Relevant Orders   US Carotid Bilateral   Medication monitoring encounter       Relevant Orders   COMPLETE METABOLIC PANEL WITH GFR   CBC with Differential/Platelet       Follow up plan: Return in about 6 months (around 02/02/2018) for follow-up visit with Dr. Sanda Klein; Medicare Wellness with [facility] when due.  An after-visit summary was printed and given to the patient at [facility].  Please see the patient instructions which may contain other information and recommendations beyond what is mentioned above in the assessment and plan.  No orders of the defined types were placed in this encounter.   Orders Placed This Encounter  Procedures  . US Carotid Bilateral  . Vitamin B12  . VITAMIN D 25 Hydroxy (Vit-D Deficiency, Fractures)  . COMPLETE METABOLIC PANEL WITH GFR  . CBC with Differential/Platelet

## 2017-08-03 NOTE — Assessment & Plan Note (Signed)
Managed by endocrinologist; last LDL and HDL reviewed with her; continue meds, try to limit saturated fats

## 2017-08-03 NOTE — Assessment & Plan Note (Signed)
Avoid NSAIDs, try to increase hydration

## 2017-08-04 LAB — COMPLETE METABOLIC PANEL WITH GFR
AG Ratio: 1.7 (calc) (ref 1.0–2.5)
ALBUMIN MSPROF: 4.3 g/dL (ref 3.6–5.1)
ALKALINE PHOSPHATASE (APISO): 98 U/L (ref 33–130)
ALT: 24 U/L (ref 6–29)
AST: 24 U/L (ref 10–35)
BUN/Creatinine Ratio: 15 (calc) (ref 6–22)
BUN: 16 mg/dL (ref 7–25)
CALCIUM: 10 mg/dL (ref 8.6–10.4)
CO2: 26 mmol/L (ref 20–32)
CREATININE: 1.08 mg/dL — AB (ref 0.50–0.99)
Chloride: 103 mmol/L (ref 98–110)
GFR, Est African American: 61 mL/min/{1.73_m2} (ref >=60)
GFR, Est Non African American: 53 mL/min/{1.73_m2} — ABNORMAL LOW (ref >=60)
GLUCOSE: 104 mg/dL (ref 65–139)
Globulin: 2.6 g/dL (calc) (ref 1.9–3.7)
Potassium: 4.8 mmol/L (ref 3.5–5.3)
Sodium: 138 mmol/L (ref 135–146)
Total Bilirubin: 0.6 mg/dL (ref 0.2–1.2)
Total Protein: 6.9 g/dL (ref 6.1–8.1)

## 2017-08-04 LAB — VITAMIN B12: Vitamin B-12: 857 pg/mL (ref 200–1100)

## 2017-08-04 LAB — CBC WITH DIFFERENTIAL/PLATELET
BASOS PCT: 1.1 %
Basophils Absolute: 50 cells/uL (ref 0–200)
Eosinophils Absolute: 131 cells/uL (ref 15–500)
Eosinophils Relative: 2.9 %
HEMATOCRIT: 43.2 % (ref 35.0–45.0)
HEMOGLOBIN: 14.5 g/dL (ref 11.7–15.5)
LYMPHS ABS: 1170 {cells}/uL (ref 850–3900)
MCH: 30.8 pg (ref 27.0–33.0)
MCHC: 33.6 g/dL (ref 32.0–36.0)
MCV: 91.7 fL (ref 80.0–100.0)
MPV: 10.8 fL (ref 7.5–12.5)
Monocytes Relative: 13.5 %
Neutro Abs: 2543 cells/uL (ref 1500–7800)
Neutrophils Relative %: 56.5 %
Platelets: 388 10*3/uL (ref 140–400)
RBC: 4.71 10*6/uL (ref 3.80–5.10)
RDW: 14.7 % (ref 11.0–15.0)
Total Lymphocyte: 26 %
WBC: 4.5 10*3/uL (ref 3.8–10.8)
WBCMIX: 608 {cells}/uL (ref 200–950)

## 2017-08-04 LAB — VITAMIN D 25 HYDROXY (VIT D DEFICIENCY, FRACTURES): Vit D, 25-Hydroxy: 23 ng/mL — ABNORMAL LOW (ref 30–100)

## 2018-02-05 ENCOUNTER — Encounter: Payer: Self-pay | Admitting: Family Medicine

## 2018-02-05 ENCOUNTER — Ambulatory Visit (INDEPENDENT_AMBULATORY_CARE_PROVIDER_SITE_OTHER): Payer: Medicare HMO | Admitting: Family Medicine

## 2018-02-05 VITALS — BP 122/70 | HR 87 | Temp 98.5°F | Ht 59.0 in | Wt 155.3 lb

## 2018-02-05 DIAGNOSIS — G5601 Carpal tunnel syndrome, right upper limb: Secondary | ICD-10-CM | POA: Diagnosis not present

## 2018-02-05 DIAGNOSIS — Z23 Encounter for immunization: Secondary | ICD-10-CM

## 2018-02-05 DIAGNOSIS — E782 Mixed hyperlipidemia: Secondary | ICD-10-CM

## 2018-02-05 DIAGNOSIS — E559 Vitamin D deficiency, unspecified: Secondary | ICD-10-CM | POA: Diagnosis not present

## 2018-02-05 DIAGNOSIS — Z5181 Encounter for therapeutic drug level monitoring: Secondary | ICD-10-CM

## 2018-02-05 DIAGNOSIS — I10 Essential (primary) hypertension: Secondary | ICD-10-CM | POA: Diagnosis not present

## 2018-02-05 DIAGNOSIS — N182 Chronic kidney disease, stage 2 (mild): Secondary | ICD-10-CM | POA: Diagnosis not present

## 2018-02-05 DIAGNOSIS — F331 Major depressive disorder, recurrent, moderate: Secondary | ICD-10-CM

## 2018-02-05 LAB — COMPLETE METABOLIC PANEL WITH GFR
AG Ratio: 1.6 (calc) (ref 1.0–2.5)
ALKALINE PHOSPHATASE (APISO): 82 U/L (ref 33–130)
ALT: 17 U/L (ref 6–29)
AST: 22 U/L (ref 10–35)
Albumin: 4 g/dL (ref 3.6–5.1)
BUN: 17 mg/dL (ref 7–25)
CALCIUM: 9.6 mg/dL (ref 8.6–10.4)
CO2: 25 mmol/L (ref 20–32)
CREATININE: 0.91 mg/dL (ref 0.50–0.99)
Chloride: 110 mmol/L (ref 98–110)
GFR, Est African American: 75 mL/min/{1.73_m2} (ref >=60)
GFR, Est Non African American: 64 mL/min/{1.73_m2} (ref >=60)
GLUCOSE: 106 mg/dL — AB (ref 65–99)
Globulin: 2.5 g/dL (calc) (ref 1.9–3.7)
Potassium: 5.6 mmol/L — ABNORMAL HIGH (ref 3.5–5.3)
SODIUM: 141 mmol/L (ref 135–146)
Total Bilirubin: 0.3 mg/dL (ref 0.2–1.2)
Total Protein: 6.5 g/dL (ref 6.1–8.1)

## 2018-02-05 LAB — LIPID PANEL
CHOLESTEROL: 182 mg/dL (ref <200)
HDL: 68 mg/dL (ref >50)
LDL CHOLESTEROL (CALC): 98 mg/dL
Non-HDL Cholesterol (Calc): 114 mg/dL (calc) (ref <130)
TRIGLYCERIDES: 71 mg/dL (ref <150)
Total CHOL/HDL Ratio: 2.7 (calc) (ref <5.0)

## 2018-02-05 NOTE — Patient Instructions (Signed)
Try to limit saturated fats in your diet (bologna, hot dogs, barbeque, cheeseburgers, hamburgers, steak, bacon, sausage, cheese, etc.) and get more fresh fruits, vegetables, and whole grains Try to follow the DASH guidelines (DASH stands for Dietary Approaches to Stop Hypertension). Try to limit the sodium in your diet to no more than 1,500mg  of sodium per day. Certainly try to not exceed 2,000 mg per day at the very most. Do not add salt when cooking or at the table.  Check the sodium amount on labels when shopping, and choose items lower in sodium when given a choice. Avoid or limit foods that already contain a lot of sodium. Eat a diet rich in fruits and vegetables and whole grains, and try to lose weight if overweight or obese

## 2018-02-05 NOTE — Assessment & Plan Note (Signed)
Excellent control today; continue medicine as she can remember; trouble shooting to keep meds handy by coffee machine; avoid salt when possible, try to follow DASH guidelines

## 2018-02-05 NOTE — Assessment & Plan Note (Signed)
Check lipids; continue statin and zetia; limit saturated fats

## 2018-02-05 NOTE — Assessment & Plan Note (Signed)
Continue supplement 1000 iu vit D daily

## 2018-02-05 NOTE — Progress Notes (Signed)
BP 122/70   Pulse 87   Temp 98.5 F (36.9 C) (Oral)   Ht 4' 11"  (1.499 m)   Wt 155 lb 4.8 oz (70.4 kg)   SpO2 97%   BMI 31.37 kg/m    Subjective:    Patient ID: Sarah Gillespie, female    DOB: 06-May-1950, 67 y.o.   MRN: [id number]  HPI: Sarah Gillespie is a 67 y.o. female  Chief Complaint  Patient presents with  . Follow-up    HPI She is here for f/u Doing well overall She does forget her medicines Mood is good and has energy; chronic recurrent depression; reviewed PHQ-9 score  High cholesterol; on 40 mg of crestor and 10 mg of zetia; no symptoms, just forgetting the medicine; even taking during the day; she keeps the medicine by the coffee machine; trying to avoid fatty meats; not much fried food; just eggs in baking or for breakfast once every two weeks  High blood pressure; well-controlled on just 10 mg lisinopril; avoiding salt, but it's hard  CKD stage 2; avoiding NSAIDs; just tylenol if needed; good water drinker  Vitamin D deficiency; taking supplementation; energy is good  She needs a new cologuard sample kit  Right hand goes numb for the last week; at night; shakes it and she wiggles it and it wakes back up; wakes her up; she sleeps many different ways, not sure if when curled up; she is right-handed; no pain in the neck or arm; just a kink now and then in her neck; nothing major; nothing else like stroke sx, swallowing, speaking, etc; had carpal tunnel on the left and had surgery; she knits and crochets a lot, has it here with her today  Depression screen [facility] 2/9 02/05/2018 02/05/2018 08/03/2017 05/23/2017 04/05/2017  Decreased Interest 0 0 0 0 0  Down, Depressed, Hopeless 0 0 1 0 0  PHQ - 2 Score 0 0 1 0 0  Altered sleeping 0 - 0 - -  Tired, decreased energy 0 - 0 - -  Change in appetite 0 - 0 - -  Feeling bad or failure about yourself  0 - 0 - -  Trouble concentrating 0 - 0 - -  Moving slowly or fidgety/restless 0 - 0 - -  Suicidal thoughts 0 - 0 - -  PHQ-9  Score 0 - 1 - -  Difficult doing work/chores Not difficult at all - Not difficult at all - -   Fall Risk  02/05/2018 08/03/2017 05/23/2017 04/05/2017 03/05/2017  Falls in the past year? 0 No No No No  Number falls in past yr: 0 - - - -    Relevant past medical, surgical, family and social history reviewed Past Medical History:  Diagnosis Date  . Angular cheilitis   . Anxiety   . Carotid atherosclerosis   . Carotid bruit   . Depression   . Elevated blood pressure   . Frequency   . Gross hematuria   . Hyperlipidemia   . Nocturia   . Pyuria   . Sigmoid diverticulosis Oct. 2015   CT scan  . Vitamin B12 deficiency   . Vitamin D deficiency disease    Past Surgical History:  Procedure Laterality Date  . ABDOMINAL HYSTERECTOMY  1987  . APPENDECTOMY  1960  . CARPAL TUNNEL RELEASE Left 1988  . EXCISION NEUROMA Left 1988  . TONSILLECTOMY  1962   Family History  Problem Relation Age of Onset  . Hypertension Mother   . Heart disease  Mother        CHF for months, aortic stenosis for years  . Clotting disorder Mother   . Aortic stenosis Mother   . Heart failure Mother   . Depression Mother   . Deep vein thrombosis Mother   . Other Sister        muscle disease, polymyositis  . Heart failure Sister   . Pneumonia Sister        cause of death  . ALS Father   . Urolithiasis Father   . Thyroid disease Father   . CVA Father   . Thyroid disease Sister   . Clotting disorder Sister   . Hyperlipidemia Sister   . Migraines Sister   . Deep vein thrombosis Sister   . Cancer Brother 50       lung, brain cancer  . Diabetes Maternal Grandmother   . Stroke Brother   . Heart murmur Brother        rheumatic fever   Social History   Tobacco Use  . Smoking status: Never Smoker  . Smokeless tobacco: Never Used  Substance Use Topics  . Alcohol use: Yes    Comment: occasional wine  . Drug use: No     Office Visit from 02/05/2018 in [facility]  AUDIT-C Score  3        Interim medical history since last visit reviewed. Allergies and medications reviewed  Review of Systems Per HPI unless specifically indicated above     Objective:    BP 122/70   Pulse 87   Temp 98.5 F (36.9 C) (Oral)   Ht 4' 11"  (1.499 m)   Wt 155 lb 4.8 oz (70.4 kg)   SpO2 97%   BMI 31.37 kg/m   Wt Readings from Last 3 Encounters:  02/05/18 155 lb 4.8 oz (70.4 kg)  08/03/17 146 lb (66.2 kg)  05/23/17 151 lb 1.6 oz (68.5 kg)    Physical Exam  Constitutional: She appears well-developed and well-nourished. No distress.  HENT:  Head: Normocephalic and atraumatic.  Eyes: EOM are normal. No scleral icterus.  Neck: No thyromegaly present.  Cardiovascular: Normal rate, regular rhythm and normal heart sounds.  No murmur heard. Pulmonary/Chest: Effort normal and breath sounds normal. No respiratory distress. She has no wheezes.  Abdominal: Soft. Bowel sounds are normal. She exhibits no distension.  Musculoskeletal: She exhibits no edema.  Neurological: She is alert.  Negative Finkelstein's and negative Tinel's; no atrophy of the right hand; grip and intrinsics 5/5 bilaterally  Skin: Skin is warm and dry. She is not diaphoretic. No pallor.  Psychiatric: She has a normal mood and affect. Her behavior is normal. Judgment and thought content normal.      Assessment & Plan:   Problem List Items Addressed This Visit      Cardiovascular and Mediastinum   Essential hypertension, benign (Chronic)    Excellent control today; continue medicine as she can remember; trouble shooting to keep meds handy by coffee machine; avoid salt when possible, try to follow DASH guidelines        Genitourinary   CKD (chronic kidney disease) stage 2, GFR 60-89 ml/min    Continue hydration; avoid NSAIDs        Other   Vitamin D deficiency disease    Continue supplement 1000 iu vit D daily      Major depressive disorder, recurrent episode, moderate (HCC)    Doing well on current  medicines; stable, chronic issue  Hyperlipidemia    Check lipids; continue statin and zetia; limit saturated fats      Relevant Orders   Lipid panel (Completed)    Other Visit Diagnoses    Carpal tunnel syndrome on right    -  Primary   Relevant Orders   Ambulatory referral to Orthopedics   Medication monitoring encounter       Relevant Orders   COMPLETE METABOLIC PANEL WITH GFR (Completed)   Need for influenza vaccination       Relevant Orders   Flu vaccine HIGH DOSE PF (Fluzone High dose) (Completed)       Follow up plan: Return in about 6 months (around 08/07/2018) for follow-up visit with Dr. Sanda Klein.  An after-visit summary was printed and given to the patient at [place].  Please see the patient instructions which may contain other information and recommendations beyond what is mentioned above in the assessment and plan.  No orders of the defined types were placed in this encounter.   Orders Placed This Encounter  Procedures  . Flu vaccine HIGH DOSE PF (Fluzone High dose)  . COMPLETE METABOLIC PANEL WITH GFR  . Lipid panel  . Ambulatory referral to Orthopedics

## 2018-02-05 NOTE — Assessment & Plan Note (Signed)
Continue hydration; avoid NSAIDs

## 2018-02-05 NOTE — Assessment & Plan Note (Signed)
Doing well on current medicines; stable, chronic issue

## 2018-02-06 NOTE — Progress Notes (Signed)
Sarah Gillespie, please let the patient know that her potassium was high; please go to the hospital for a recheck potassium (please ORDER); do not take any potassium supplements or use any salt substitutes like "Nu salt" or "lite salt" Her LDL is much better than a year ago; her LDL is down from 165 to 98; however, we want it even lower; make sure she is taking the crestor plus the zetia every single day; urge her to really work on eating a healthy diet; if she is interested in going vegetarian or vegan to help her cholesterol, we can help her with that; REFER to nutritionist if interested

## 2018-02-07 ENCOUNTER — Telehealth: Payer: Self-pay

## 2018-02-07 DIAGNOSIS — E875 Hyperkalemia: Secondary | ICD-10-CM

## 2018-02-07 NOTE — Telephone Encounter (Signed)
-----   Message from Arnetha Courser, MD sent at 02/06/2018  3:05 PM EST ----- Joelene Millin, please let the patient know that her potassium was high; please go to the hospital for a recheck potassium (please ORDER); do not take any potassium supplements or use any salt substitutes like "Nu salt" or "lite salt" Her LDL is much better than a year ago; her LDL is down from 165 to 98; however, we want it even lower; make sure she is taking the crestor plus the zetia every single day; urge her to really work on eating a healthy diet; if she is interested in going vegetarian or vegan to help her cholesterol, we can help her with that; REFER to nutritionist if interested

## 2018-03-11 ENCOUNTER — Encounter: Payer: Self-pay | Admitting: Family Medicine

## 2018-03-21 ENCOUNTER — Other Ambulatory Visit: Payer: Self-pay | Admitting: Family Medicine

## 2018-03-27 ENCOUNTER — Other Ambulatory Visit: Payer: Self-pay

## 2018-03-27 ENCOUNTER — Encounter: Payer: Self-pay | Admitting: Family Medicine

## 2018-03-27 MED ORDER — VENLAFAXINE HCL ER 37.5 MG PO CP24: 37.5000 mg | ORAL_CAPSULE | Freq: Every day | ORAL | 11 refills | Status: DC

## 2018-04-09 ENCOUNTER — Ambulatory Visit (INDEPENDENT_AMBULATORY_CARE_PROVIDER_SITE_OTHER): Payer: Medicare HMO

## 2018-04-09 ENCOUNTER — Other Ambulatory Visit: Payer: Self-pay | Admitting: Family Medicine

## 2018-04-09 VITALS — BP 124/82 | HR 80 | Temp 97.5°F | Resp 16 | Ht 59.0 in | Wt 152.8 lb

## 2018-04-09 DIAGNOSIS — Z0000 Encounter for general adult medical examination without abnormal findings: Secondary | ICD-10-CM | POA: Diagnosis not present

## 2018-04-09 DIAGNOSIS — Z780 Asymptomatic menopausal state: Secondary | ICD-10-CM

## 2018-04-09 DIAGNOSIS — Z1231 Encounter for screening mammogram for malignant neoplasm of breast: Secondary | ICD-10-CM

## 2018-04-09 NOTE — Progress Notes (Signed)
Subjective:   Sarah Gillespie is a 68 y.o. female who presents for Medicare Annual (Subsequent) preventive examination.  Review of Systems:    Cardiac Risk Factors include: advanced age (>56mn, >>66women);hypertension;dyslipidemia;obesity (BMI >30kg/m2)     Objective:     Vitals: BP 124/82 (BP Location: Left Arm, Patient Position: Sitting, Cuff Size: Normal)   Pulse 80   Temp (!) 97.5 F (36.4 C) (Oral)   Resp 16   Ht _0  (1.499 m)   Wt 152 lb 12.8 oz (69.3 kg)   SpO2 97%   BMI 30.86 kg/m   Body mass index is 30.86 kg/m.  Advanced Directives 04/09/2018 11/02/2016 08/01/2016 01/31/2016 09/09/2015 07/30/2015  Does Patient Have a Medical Advance Directive? _1  No  Would patient like information on creating a medical advance directive? No - Patient declined - - - No - patient declined information No - patient declined information    Tobacco Social History   Tobacco Use  Smoking Status Never Smoker  Smokeless Tobacco Never Used     Counseling given: Not Answered   Clinical Intake:  Pre-visit preparation completed: Yes  Pain : No/denies pain     Nutritional Status: BMI > 30  Obese Nutritional Risks: None Diabetes: No  How often do you need to have someone help you when you read instructions, pamphlets, or other written materials from your doctor or pharmacy?: 1 - Never What is the last grade level you completed in school?: some college  Interpreter Needed?: No  Information entered by :: KClemetine MarkerLPN  Past Medical History:  Diagnosis Date  . Angular cheilitis   . Anxiety   . Carotid atherosclerosis   . Carotid bruit   . Depression   . Elevated blood pressure   . Frequency   . Gross hematuria   . Hyperlipidemia   . Hypertension   . Nocturia   . Pyuria   . Sigmoid diverticulosis Oct. 2015   CT scan  . Vitamin B12 deficiency   . Vitamin D deficiency disease    Past Surgical History:  Procedure Laterality Date  . ABDOMINAL HYSTERECTOMY   1987  . APPENDECTOMY  1960  . CARPAL TUNNEL RELEASE Left 1988  . EXCISION NEUROMA Left 1988  . TONSILLECTOMY  1962   Family History  Problem Relation Age of Onset  . Hypertension Mother   . Heart disease Mother        CHF for months, aortic stenosis for years  . Clotting disorder Mother   . Aortic stenosis Mother   . Heart failure Mother   . Depression Mother   . Deep vein thrombosis Mother   . Other Sister        muscle disease, polymyositis  . Heart failure Sister   . Pneumonia Sister        cause of death  . ALS Father   . Urolithiasis Father   . Thyroid disease Father   . CVA Father   . Thyroid disease Sister   . Clotting disorder Sister   . Hyperlipidemia Sister   . Migraines Sister   . Deep vein thrombosis Sister   . Cancer Brother 50       lung, brain cancer  . Diabetes Maternal Grandmother   . Stroke Brother   . Heart murmur Brother        rheumatic fever   Social History   Socioeconomic History  . Marital status: Divorced    Spouse name: Not on  file  . Number of children: 2  . Years of education: Not on file  . Highest education level: Some college, no degree  Occupational History  . Occupation: retired  Scientific laboratory technician  . Financial resource strain: Not hard at all  . Food insecurity:    Worry: Never true    Inability: Never true  . Transportation needs:    Medical: No    Non-medical: No  Tobacco Use  . Smoking status: Never Smoker  . Smokeless tobacco: Never Used  Substance and Sexual Activity  . Alcohol use: Yes    Comment: occasional wine  . Drug use: No  . Sexual activity: Not Currently  Lifestyle  . Physical activity:    Days per week: 0 days    Minutes per session: 0 min  . Stress: Not at all  Relationships  . Social connections:    Talks on phone: More than three times a week    Gets together: Three times a week    Attends religious service: Never    Active member of club or organization: No    Attends meetings of clubs or  organizations: Never    Relationship status: Divorced  Other Topics Concern  . Not on file  Social History Narrative  . Not on file    Outpatient Encounter Medications as of 04/09/2018  Medication Sig  . aspirin EC 81 MG tablet Take 81 mg by mouth daily.  Marland Kitchen CALCIUM PO Take 500 mg by mouth daily.   . Cholecalciferol (VITAMIN D) 2000 UNITS CAPS Take 2,000 Units by mouth daily.  Marland Kitchen ezetimibe (ZETIA) 10 MG tablet Take 1 tablet (10 mg total) by mouth daily.  Marland Kitchen lisinopril (PRINIVIL,ZESTRIL) 10 MG tablet Take 1 tablet (10 mg total) by mouth daily. For blood pressure  . rosuvastatin (CRESTOR) 40 MG tablet Take 1 tablet (40 mg total) by mouth daily.  Marland Kitchen venlafaxine XR (EFFEXOR-XR) 37.5 MG 24 hr capsule Take 1 capsule (37.5 mg total) by mouth daily with breakfast.  . vitamin B-12 (CYANOCOBALAMIN) 1000 MCG tablet Take 1 tablet by mouth daily.   No facility-administered encounter medications on file as of 04/09/2018.     Activities of Daily Living In your present state of health, do you have any difficulty performing the following activities: 04/09/2018 02/05/2018  Hearing? N N  Comment declines hearing aids -  Vision? N N  Comment wears glasses -  Difficulty concentrating or making decisions? N N  Walking or climbing stairs? N N  Dressing or bathing? N N  Doing errands, shopping? N N  Preparing Food and eating ? N -  Using the Toilet? N -  In the past six months, have you accidently leaked urine? N -  Do you have problems with loss of bowel control? N -  Managing your Medications? N -  Managing your Finances? N -  Housekeeping or managing your Housekeeping? N -  Some recent data might be hidden    Patient Care Team: Lada, Satira Anis, MD as PCP - General (Family Medicine) Emmaline Kluver., MD as Consulting Physician (Rheumatology) Teodoro Spray, MD as Consulting Physician (Cardiology) Renato Shin, MD as Consulting Physician (Endocrinology)    Assessment:   This is a routine  wellness examination for Sarah Gillespie.  Exercise Activities and Dietary recommendations Current Exercise Habits: The patient does not participate in regular exercise at present, Exercise limited by: None identified  Goals    . DIET - EAT MORE FRUITS AND VEGETABLES    .  Exercise 150 min/wk Moderate Activity    . Reduce fat intake     Limit saturated fats       Fall Risk Fall Risk  04/09/2018 02/05/2018 08/03/2017 05/23/2017 04/05/2017  Falls in the past year? 0 0 No No No  Number falls in past yr: 0 0 - - -  Injury with Fall? 0 - - - -  Follow up Falls prevention discussed - - - -   FALL RISK PREVENTION PERTAINING TO THE HOME:  Any stairs in or around the home WITH handrails? Yes  outside only with handrail Home free of loose throw rugs in walkways, pet beds, electrical cords, etc? Yes  Adequate lighting in your home to reduce risk of falls? Yes   ASSISTIVE DEVICES UTILIZED TO PREVENT FALLS:  Life alert? No  Use of a cane, walker or w/c? No  Grab bars in the bathroom? No  Shower chair or bench in shower? No  Elevated toilet seat or a handicapped toilet? No   DME ORDERS:  DME order needed?  No   TIMED UP AND GO:  Was the test performed? Yes .  Length of time to ambulate 10 feet: 6 sec.   GAIT:  Appearance of gait: Gait stead-fast and without the use of an assistive device.  Education: Fall risk prevention has been discussed.  Intervention(s) required? No   Depression Screen PHQ 2/9 Scores 04/09/2018 02/05/2018 02/05/2018 08/03/2017  PHQ - 2 Score 0 0 0 1  PHQ- 9 Score 0 0 - 1     Cognitive Function     6CIT Screen 04/09/2018 04/05/2017  What Year? 0 points 0 points  What month? 0 points 0 points  What time? 0 points 0 points  Count back from 20 0 points 0 points  Months in reverse 0 points 0 points  Repeat phrase 0 points 2 points  Total Score 0 2    Immunization History  Administered Date(s) Administered  . Influenza, High Dose Seasonal PF 11/04/2015, 11/02/2016,  02/05/2018  . Influenza,inj,Quad PF,6+ Mos 12/03/2014  . Pneumococcal Conjugate-13 12/03/2014  . Pneumococcal Polysaccharide-23 02/02/2017  . Zoster 05/22/2013    Qualifies for Shingles Vaccine? Yes  Zostavax completed 2015. Due for Shingrix. Education has been provided regarding the importance of this vaccine. Pt has been advised to call insurance company to determine out of pocket expense. Advised may also receive vaccine at local pharmacy or Health Dept. Verbalized acceptance and understanding.  Tdap: Although this vaccine is not a covered service during a Wellness Exam, does the patient still wish to receive this vaccine today?  No .  Education has been provided regarding the importance of this vaccine. Advised may receive this vaccine at local pharmacy or Health Dept. Aware to provide a copy of the vaccination record if obtained from local pharmacy or Health Dept. Verbalized acceptance and understanding.  Flu Vaccine: Up to date  Pneumococcal Vaccine: Up to date   Screening Tests Health Maintenance  Topic Date Due  . MAMMOGRAM  09/03/1966  . Fecal DNA (Cologuard)  09/03/1998  . DEXA SCAN  09/02/2013  . TETANUS/TDAP  04/10/2019 (Originally 09/03/1967)  . INFLUENZA VACCINE  Completed  . Hepatitis C Screening  Completed  . PNA vac Low Risk Adult  Completed   Cancer Screenings:  Colorectal Screening: Not completed, pt had Cologuard kit at home but her dog urinated on the kit and was not able to complete the testing.   Mammogram: DUE:  Ordered today. Pt provided with contact  information and advised to call to schedule appt.   Bone Density: DUE:  Ordered today. Pt provided with contact information and advised to call to schedule appt.   Lung Cancer Screening: (Low Dose CT Chest recommended if Age 72-80 years, 30 pack-year currently smoking OR have quit w/in 15years.) does not qualify.   Additional Screening:  Hepatitis C Screening: does qualify; Completed 07/30/15  Vision  Screening: Recommended annual ophthalmology exams for early detection of glaucoma and other disorders of the eye. Is the patient up to date with their annual eye exam?  Yes  Who is the provider or what is the name of the office in which the pt attends annual eye exams? [facility]   Dental Screening: Recommended annual dental exams for proper oral hygiene  Community Resource Referral:  CRR required this visit?  No        Plan:     I have personally reviewed and addressed the Medicare Annual Wellness questionnaire and have noted the following in the patient's chart:  A. Medical and social history B. Use of alcohol, tobacco or illicit drugs  C. Current medications and supplements D. Functional ability and status E.  Nutritional status F.  Physical activity G. Advance directives H. List of other physicians I.  Hospitalizations, surgeries, and ER visits in previous 12 months J.  [place] such as hearing and vision if needed, cognitive and depression L. Referrals and appointments   In addition, I have reviewed and discussed with patient certain preventive protocols, quality metrics, and best practice recommendations. A written personalized care plan for preventive services as well as general preventive health recommendations were provided to patient.   Signed,  Clemetine Marker, LPN Nurse Health Advisor   Nurse Notes: pt doing well today and appreciative of visit.

## 2018-04-09 NOTE — Telephone Encounter (Addendum)
We haven't prescribed this medicine for her since 2017 Someone else must be writing the prescriptions Please find out who and direct her to contact that doctor If she avers that we are the ones prescribing it, check with pharmacy because there are some serious noncompliance issues going on

## 2018-04-09 NOTE — Telephone Encounter (Signed)
Pt seen in office today for AWV. Pt requests refill on lisinopril 10mg  to be sent to [facility]. Thank you!

## 2018-04-09 NOTE — Addendum Note (Signed)
Addended by: Sibyl Parr on: 04/09/2018 10:22 AM   Modules accepted: Orders

## 2018-04-09 NOTE — Addendum Note (Signed)
Addended by: LADA, Satira Anis on: 04/09/2018 08:15 PM   Modules accepted: Orders

## 2018-04-09 NOTE — Patient Instructions (Signed)
Sarah Gillespie , Thank you for taking time to come for your Medicare Wellness Visit. I appreciate your ongoing commitment to your health goals. Please review the following plan we discussed and let me know if I can assist you in the future.   Screening recommendations/referrals: Colonoscopy: due - will have new Cologuard sent out Mammogram: Please call [phone] to schedule your mammogram bone density screening.   Recommended yearly ophthalmology/optometry visit for glaucoma screening and checkup Recommended yearly dental visit for hygiene and checkup  Vaccinations: Influenza vaccine: done 02/05/18 Pneumococcal vaccine: done 02/02/17 Tdap vaccine: due - please contact us if you get a cut or scrape Shingles vaccine: Shingrix discussed. Please contact your pharmacy for coverage information.     Advanced directives: Please bring a copy of your health care power of attorney and living will to the office at your convenience once you have it completed.   Conditions/risks identified: Recommend 150 minutes per week of physical activity.   Next appointment: Please follow up in one year for your Medicare Annual Wellness visit.     Preventive Care 63 Years and Older, Female Preventive care refers to lifestyle choices and visits with your health care provider that can promote health and wellness. What does preventive care include?  A yearly physical exam. This is also called an annual well check.  Dental exams once or twice a year.  Routine eye exams. Ask your health care provider how often you should have your eyes checked.  Personal lifestyle choices, including:  Daily care of your teeth and gums.  Regular physical activity.  Eating a healthy diet.  Avoiding tobacco and drug use.  Limiting alcohol use.  Practicing safe sex.  Taking low-dose aspirin every day.  Taking vitamin and mineral supplements as recommended by your health care provider. What happens during an annual  well check? The services and screenings done by your health care provider during your annual well check will depend on your age, overall health, lifestyle risk factors, and family history of disease. Counseling  Your health care provider may ask you questions about your:  Alcohol use.  Tobacco use.  Drug use.  Emotional well-being.  Home and relationship well-being.  Sexual activity.  Eating habits.  History of falls.  Memory and ability to understand (cognition).  Work and work Statistician.  Reproductive health. Screening  You may have the following tests or measurements:  Height, weight, and BMI.  Blood pressure.  Lipid and cholesterol levels. These may be checked every 5 years, or more frequently if you are over 78 years old.  Skin check.  Lung cancer screening. You may have this screening every year starting at age 56 if you have a 30-pack-year history of smoking and currently smoke or have quit within the past 15 years.  Fecal occult blood test (FOBT) of the stool. You may have this test every year starting at age 12.  Flexible sigmoidoscopy or colonoscopy. You may have a sigmoidoscopy every 5 years or a colonoscopy every 10 years starting at age 25.  Hepatitis C blood test.  Hepatitis B blood test.  Sexually transmitted disease (STD) testing.  Diabetes screening. This is done by checking your blood sugar (glucose) after you have not eaten for a while (fasting). You may have this done every 1-3 years.  Bone density scan. This is done to screen for osteoporosis. You may have this done starting at age 20.  Mammogram. This may be done every 1-2 years. Talk to your health care provider  about how often you should have regular mammograms. Talk with your health care provider about your test results, treatment options, and if necessary, the need for more tests. Vaccines  Your health care provider may recommend certain vaccines, such as:  Influenza vaccine. This  is recommended every year.  Tetanus, diphtheria, and acellular pertussis (Tdap, Td) vaccine. You may need a Td booster every 10 years.  Zoster vaccine. You may need this after age 66.  Pneumococcal 13-valent conjugate (PCV13) vaccine. One dose is recommended after age 80.  Pneumococcal polysaccharide (PPSV23) vaccine. One dose is recommended after age 23. Talk to your health care provider about which screenings and vaccines you need and how often you need them. This information is not intended to replace advice given to you by your health care provider. Make sure you discuss any questions you have with your health care provider. Document Released: 03/19/2015 Document Revised: 11/10/2015 Document Reviewed: 12/22/2014 Elsevier Interactive Patient Education  2017 [employer] Prevention in the Home Falls can cause injuries. They can happen to people of all ages. There are many things you can do to make your home safe and to help prevent falls. What can I do on the outside of my home?  Regularly fix the edges of walkways and driveways and fix any cracks.  Remove anything that might make you trip as you walk through a door, such as a raised step or threshold.  Trim any bushes or trees on the path to your home.  Use bright outdoor lighting.  Clear any walking paths of anything that might make someone trip, such as rocks or tools.  Regularly check to see if handrails are loose or broken. Make sure that both sides of any steps have handrails.  Any raised decks and porches should have guardrails on the edges.  Have any leaves, snow, or ice cleared regularly.  Use sand or salt on walking paths during winter.  Clean up any spills in your garage right away. This includes oil or grease spills. What can I do in the bathroom?  Use night lights.  Install grab bars by the toilet and in the tub and shower. Do not use towel bars as grab bars.  Use non-skid mats or decals in the tub or  shower.  If you need to sit down in the shower, use a plastic, non-slip stool.  Keep the floor dry. Clean up any water that spills on the floor as soon as it happens.  Remove soap buildup in the tub or shower regularly.  Attach bath mats securely with double-sided non-slip rug tape.  Do not have throw rugs and other things on the floor that can make you trip. What can I do in the bedroom?  Use night lights.  Make sure that you have a light by your bed that is easy to reach.  Do not use any sheets or blankets that are too big for your bed. They should not hang down onto the floor.  Have a firm chair that has side arms. You can use this for support while you get dressed.  Do not have throw rugs and other things on the floor that can make you trip. What can I do in the kitchen?  Clean up any spills right away.  Avoid walking on wet floors.  Keep items that you use a lot in easy-to-reach places.  If you need to reach something above you, use a strong step stool that has a grab bar.  Keep electrical cords out of the way.  Do not use floor polish or wax that makes floors slippery. If you must use wax, use non-skid floor wax.  Do not have throw rugs and other things on the floor that can make you trip. What can I do with my stairs?  Do not leave any items on the stairs.  Make sure that there are handrails on both sides of the stairs and use them. Fix handrails that are broken or loose. Make sure that handrails are as long as the stairways.  Check any carpeting to make sure that it is firmly attached to the stairs. Fix any carpet that is loose or worn.  Avoid having throw rugs at the top or bottom of the stairs. If you do have throw rugs, attach them to the floor with carpet tape.  Make sure that you have a light switch at the top of the stairs and the bottom of the stairs. If you do not have them, ask someone to add them for you. What else can I do to help prevent  falls?  Wear shoes that:  Do not have high heels.  Have rubber bottoms.  Are comfortable and fit you well.  Are closed at the toe. Do not wear sandals.  If you use a stepladder:  Make sure that it is fully opened. Do not climb a closed stepladder.  Make sure that both sides of the stepladder are locked into place.  Ask someone to hold it for you, if possible.  Clearly mark and make sure that you can see:  Any grab bars or handrails.  First and last steps.  Where the edge of each step is.  Use tools that help you move around (mobility aids) if they are needed. These include:  Canes.  Walkers.  Scooters.  Crutches.  Turn on the lights when you go into a dark area. Replace any light bulbs as soon as they burn out.  Set up your furniture so you have a clear path. Avoid moving your furniture around.  If any of your floors are uneven, fix them.  If there are any pets around you, be aware of where they are.  Review your medicines with your doctor. Some medicines can make you feel dizzy. This can increase your chance of falling. Ask your doctor what other things that you can do to help prevent falls. This information is not intended to replace advice given to you by your health care provider. Make sure you discuss any questions you have with your health care provider. Document Released: 12/17/2008 Document Revised: 07/29/2015 Document Reviewed: 03/27/2014 Elsevier Interactive Patient Education  2017 [employer].

## 2018-04-10 NOTE — Telephone Encounter (Signed)
Contacted [facility] they did not have a record of fill for pt of lisinopril.   Called patient and left message to verify this is the prescription she needs. Pt to call me directly at [phone]

## 2018-04-21 ENCOUNTER — Other Ambulatory Visit: Payer: Self-pay | Admitting: Family Medicine

## 2018-04-22 NOTE — Telephone Encounter (Signed)
Ins wanting 90 day

## 2018-05-17 ENCOUNTER — Other Ambulatory Visit: Payer: Self-pay | Admitting: Endocrinology

## 2018-07-22 ENCOUNTER — Encounter: Payer: Self-pay | Admitting: Family Medicine

## 2018-07-22 ENCOUNTER — Ambulatory Visit: Payer: Self-pay

## 2018-07-22 NOTE — Telephone Encounter (Signed)
Pt c/o pain redness and clear drainage from an old surgical scar to her lower right abdomen. Pt stated that it has an indention, and is 2 inches long. Pt noted the redness Saturday. Pt has been putting powder, aloe and antibacterial cream to site without improvement. Pt stated that the clear drainage has decreased in amount. Denies fever or chills or rash.  Care advice given and pt verbalized understanding. Call transferred to office.  Reason for Disposition . Other signs of wound infection    Clear drainage  Answer Assessment - Initial Assessment Questions 1. LOCATION: "Where is the wound located?"      Lower right abdomen 2. WOUND APPEARANCE: "What does the wound look like?"      Has an indent red and uncomfortable 3. SIZE: If redness is present, ask: "What is the size of the red area?" (Inches, centimeters, or compare to size of a coin)      2 inches 4. SPREAD: "What's changed in the last day?"  "Do you see any red streaks coming from the wound?"     More sore today 5. ONSET: "When did it start to look infected?"      Sat 6. MECHANISM: "How did the wound start, what was the cause?"    Old surgical scar  7. PAIN: "Is there any pain?" If so, ask: "How bad is the pain?"   (Scale 1-10; or mild, moderate, severe)     7-8  8. FEVER: "Do you have a fever?" If so, ask: "What is your temperature, how was it measured, and when did it start?"     no 9. OTHER SYMPTOMS: "Do you have any other symptoms?" (e.g., shaking chills, weakness, rash elsewhere on body)     soreness 10. PREGNANCY: "Is there any chance you are pregnant?" "When was your last menstrual period?"       n/a  Protocols used: WOUND INFECTION-A-AH

## 2018-07-23 ENCOUNTER — Encounter: Payer: Self-pay | Admitting: Nurse Practitioner

## 2018-07-23 ENCOUNTER — Ambulatory Visit (INDEPENDENT_AMBULATORY_CARE_PROVIDER_SITE_OTHER): Payer: Medicare HMO | Admitting: Nurse Practitioner

## 2018-07-23 ENCOUNTER — Other Ambulatory Visit: Payer: Self-pay

## 2018-07-23 VITALS — BP 136/74 | HR 94 | Temp 98.1°F | Resp 14 | Ht 59.0 in | Wt 158.1 lb

## 2018-07-23 DIAGNOSIS — L304 Erythema intertrigo: Secondary | ICD-10-CM | POA: Diagnosis not present

## 2018-07-23 MED ORDER — KETOCONAZOLE 2 % EX CREA: 1.0000 "application " | TOPICAL_CREAM | Freq: Two times a day (BID) | CUTANEOUS | 0 refills | Status: DC

## 2018-07-23 MED ORDER — HYDROCORTISONE 1 % EX OINT: 1.0000 "application " | TOPICAL_OINTMENT | Freq: Two times a day (BID) | CUTANEOUS | 0 refills | Status: DC

## 2018-07-23 NOTE — Progress Notes (Signed)
Name: Sarah Gillespie   MRN: [id number]    DOB: 1950/03/24   Date:07/23/2018       Progress Note  Subjective  Chief Complaint  Chief Complaint  Patient presents with  . Wound Infection    pt presents with an infected wound from an abdominal scar. 1st noticed this on Sat. tried triple atb ointment and tylenol.    HPI  States Saturday when she went to the bathroom noticed a wet spot on undergarments. States then she noticed area of skin under abdominal fold that was red and raw. States is its burning and stinging. Has used warm compress, triple antibiotic ointment and tylenol with minimal relief. Endorses scant clear drainage. Some itching. Denies fever, chills, injury. Does not wear tight clothing.   PHQ2/9: Depression screen [facility] 2/9 07/23/2018 04/09/2018 02/05/2018 02/05/2018 08/03/2017  Decreased Interest 0 0 0 0 0  Down, Depressed, Hopeless 0 0 0 0 1  PHQ - 2 Score 0 0 0 0 1  Altered sleeping 0 0 0 - 0  Tired, decreased energy 0 0 0 - 0  Change in appetite 0 0 0 - 0  Feeling bad or failure about yourself  0 0 0 - 0  Trouble concentrating 0 0 0 - 0  Moving slowly or fidgety/restless 0 0 0 - 0  Suicidal thoughts 0 0 0 - 0  PHQ-9 Score 0 0 0 - 1  Difficult doing work/chores Not difficult at all Not difficult at all Not difficult at all - Not difficult at all     PHQ reviewed. Negative  Patient Active Problem List   Diagnosis Date Noted  . DNR no code (do not resuscitate) 04/05/2017  . Mild concentric left ventricular hypertrophy (LVH) 03/20/2017  . Grade I diastolic dysfunction 65/99/3570  . Cardiac murmur 03/05/2017  . CKD (chronic kidney disease) stage 2, GFR 60-89 ml/min 11/02/2016  . Knee sprain and strain 11/08/2015  . Sciatica 10/08/2015  . Postmenopausal 12/16/2014  . Trochanteric bursitis of both hips 12/07/2014  . Hyperlipidemia   . Depression   . Vitamin B12 deficiency   . Vitamin D deficiency disease   . Carotid atherosclerosis   . Essential hypertension, benign    . Major depressive disorder, recurrent episode, moderate ([place]) 12/10/2013  . Other chest pain 12/10/2013  . Sigmoid diverticulosis 12/04/2013    Past Medical History:  Diagnosis Date  . Angular cheilitis   . Anxiety   . Carotid atherosclerosis   . Carotid bruit   . Depression   . Elevated blood pressure   . Frequency   . Gross hematuria   . Hyperlipidemia   . Hypertension   . Nocturia   . Pyuria   . Sigmoid diverticulosis Oct. 2015   CT scan  . Vitamin B12 deficiency   . Vitamin D deficiency disease     Past Surgical History:  Procedure Laterality Date  . ABDOMINAL HYSTERECTOMY  1987  . APPENDECTOMY  1960  . CARPAL TUNNEL RELEASE Left 1988  . EXCISION NEUROMA Left 1988  . TONSILLECTOMY  1962    Social History   Tobacco Use  . Smoking status: Never Smoker  . Smokeless tobacco: Never Used  Substance Use Topics  . Alcohol use: Yes    Comment: occasional wine     Current Outpatient Medications:  .  aspirin EC 81 MG tablet, Take 81 mg by mouth daily., Disp: , Rfl:  .  CALCIUM PO, Take 500 mg by mouth daily. , Disp: , Rfl:  .  Cholecalciferol (VITAMIN D) 2000 UNITS CAPS, Take 2,000 Units by mouth daily., Disp: , Rfl:  .  ezetimibe (ZETIA) 10 MG tablet, Take 1 tablet (10 mg total) by mouth daily., Disp: 30 tablet, Rfl: 11 .  lisinopril (PRINIVIL,ZESTRIL) 10 MG tablet, Take 1 tablet (10 mg total) by mouth daily. For blood pressure, Disp: 90 tablet, Rfl: 3 .  rosuvastatin (CRESTOR) 40 MG tablet, Take 1 tablet (40 mg total) by mouth daily., Disp: 30 tablet, Rfl: 11 .  venlafaxine XR (EFFEXOR-XR) 37.5 MG 24 hr capsule, TAKE 1 CAPSULE (37.5 MG TOTAL) BY MOUTH DAILY WITH BREAKFAST., Disp: 90 capsule, Rfl: 3 .  vitamin B-12 (CYANOCOBALAMIN) 1000 MCG tablet, Take 1 tablet by mouth daily., Disp: , Rfl:   No Known Allergies  ROS    No other specific complaints in a complete review of systems (except as listed in HPI above).  Objective  Vitals:   07/23/18 1353  BP:  136/74  Pulse: 94  Resp: 14  Temp: 98.1 F (36.7 C)  TempSrc: Oral  SpO2: 94%  Weight: 158 lb 1.6 oz (71.7 kg)  Height: 4\' 11"  (1.499 m)     Body mass index is 31.93 kg/m.  Nursing Note and Vital Signs reviewed.  Physical Exam Constitutional:      Appearance: Normal appearance.  HENT:     Head: Normocephalic and atraumatic.  Eyes:     Conjunctiva/sclera: Conjunctivae normal.  Cardiovascular:     Rate and Rhythm: Normal rate.  Pulmonary:     Effort: Pulmonary effort is normal.  Abdominal:     General: Abdomen is flat.     Palpations: Abdomen is soft.     Tenderness: There is no abdominal tenderness.  Musculoskeletal: Normal range of motion.  Skin:    General: Skin is warm and dry.       Neurological:     General: No focal deficit present.     Mental Status: She is alert and oriented to person, place, and time.  Psychiatric:        Mood and Affect: Mood normal.        Behavior: Behavior normal.       No results found for this or any previous visit (from the past 48 hour(s)).  Assessment & Plan  1. Intertrigo Keep dry, and aerate if possible, prevent skin from touching skin if worsening at any point or not improving in a week let us know can consider oral antibiotics if cellulitis develops.  - ketoconazole (NIZORAL) 2 % cream; Apply 1 application topically 2 (two) times daily. Apply twice daily to dry skin  Dispense: 15 g; Refill: 0 - hydrocortisone 1 % ointment; Apply 1 application topically 2 (two) times daily. Can apply small amount with ketoconazole cream.  Dispense: 30 g; Refill: 0

## 2018-07-23 NOTE — Patient Instructions (Addendum)
-Daily cleansing of intertriginous skin with a mild cleanser followed by drying of affected area with a hair dryer on a cool setting -Aeration of affected area when feasible -Daily application of drying powders -Use of absorbent material or clothing, such as cotton or merino wool, to separate skin in folds - Use ketoconazole cream liberally to area, can additionally add a little bit of hydrocortisone cream (for itching) and OTC antibiotic cream if wanted. If symptoms are not improving within a week, or worsen at any time please let us know immediately.   Intertrigo Intertrigo is skin irritation or inflammation (dermatitis) that occurs when folds of skin rub together. The irritation can cause a rash and make skin raw and itchy. This condition most commonly occurs in the skin folds of these areas:  Toes.  Armpits.  Groin.  Under the belly.  Under the breasts.  Buttocks. Intertrigo is not passed from person to person (is not contagious). What are the causes? This condition is caused by heat, moisture, rubbing (friction), and not enough air circulation. The condition can be made worse by:  Sweat.  Bacteria.  A fungus, such as yeast. What increases the risk? This condition is more likely to occur if you have moisture in your skin folds. You are more likely to develop this condition if you:  Have diabetes.  Are overweight.  Are not able to move around or are not active.  Live in a warm and moist climate.  Wear splints, braces, or other medical devices.  Are not able to control your bowels or bladder (have incontinence). What are the signs or symptoms? Symptoms of this condition include:  A pink or red skin rash in the skin fold or near the skin fold.  Raw or scaly skin.  Itchiness.  A burning feeling.  Bleeding.  Leaking fluid.  A bad smell. How is this diagnosed? This condition is diagnosed with a medical history and physical exam. You may also have a skin swab  to test for bacteria or a fungus. How is this treated? This condition may be treated by:  Cleaning and drying your skin.  Taking an antibiotic medicine or using an antibiotic skin cream for a bacterial infection.  Using an antifungal cream on your skin or taking pills for an infection that was caused by a fungus, such as yeast.  Using a steroid ointment to relieve itchiness and irritation.  Separating the skin fold with a clean cotton cloth to absorb moisture and allow air to flow into the area. Follow these instructions at home:  Keep the affected area clean and dry.  Do not scratch your skin.  Stay in a cool environment as much as possible. Use an air conditioner or fan, if available.  Apply over-the-counter and prescription medicines only as told by your health care provider.  If you were prescribed an antibiotic medicine, use it as told by your health care provider. Do not stop using the antibiotic even if your condition improves.  Keep all follow-up visits as told by your health care provider. This is important. How is this prevented?  Maintain a healthy weight.  Take care of your feet, especially if you have diabetes. Foot care includes: ? Wearing shoes that fit well. ? Keeping your feet dry. ? Wearing clean, breathable socks.  Protect the skin around your groin and buttocks, especially if you have incontinence. Skin protection includes: ? Following a regular cleaning routine. ? Using skin protectant creams, powders, or ointments. ? Changing protection  pads frequently.  Do not wear tight clothes. Wear clothes that are loose, absorbent, and made of cotton.  Wear a bra that gives good support, if needed.  Shower and dry yourself well after activity or exercise. Use a hair dryer on a cool setting to dry between skin folds, especially after you bathe.  If you have diabetes, keep your blood sugar under control. Contact a health care provider if:  Your symptoms do not  improve with treatment.  Your symptoms get worse or they spread.  You notice increased redness and warmth.  You have a fever. Summary  Intertrigo is skin irritation or inflammation (dermatitis) that occurs when folds of skin rub together.  This condition is caused by heat, moisture, rubbing (friction), and not enough air circulation.  This condition may be treated by cleaning and drying your skin and with medicines.  Apply over-the-counter and prescription medicines only as told by your health care provider.  Keep all follow-up visits as told by your health care provider. This is important. This information is not intended to replace advice given to you by your health care provider. Make sure you discuss any questions you have with your health care provider. Document Released: 02/20/2005 Document Revised: 07/23/2017 Document Reviewed: 07/23/2017 Elsevier Interactive Patient Education  2019 [employer].

## 2018-08-07 ENCOUNTER — Other Ambulatory Visit: Payer: Self-pay

## 2018-08-07 ENCOUNTER — Ambulatory Visit (INDEPENDENT_AMBULATORY_CARE_PROVIDER_SITE_OTHER): Payer: Medicare HMO | Admitting: Nurse Practitioner

## 2018-08-07 ENCOUNTER — Encounter: Payer: Self-pay | Admitting: Nurse Practitioner

## 2018-08-07 VITALS — BP 124/78 | HR 83 | Temp 98.3°F | Resp 14 | Ht 59.0 in | Wt 157.9 lb

## 2018-08-07 DIAGNOSIS — Z5181 Encounter for therapeutic drug level monitoring: Secondary | ICD-10-CM | POA: Diagnosis not present

## 2018-08-07 DIAGNOSIS — N182 Chronic kidney disease, stage 2 (mild): Secondary | ICD-10-CM

## 2018-08-07 DIAGNOSIS — I10 Essential (primary) hypertension: Secondary | ICD-10-CM

## 2018-08-07 DIAGNOSIS — F331 Major depressive disorder, recurrent, moderate: Secondary | ICD-10-CM | POA: Diagnosis not present

## 2018-08-07 DIAGNOSIS — I6521 Occlusion and stenosis of right carotid artery: Secondary | ICD-10-CM | POA: Diagnosis not present

## 2018-08-07 DIAGNOSIS — L304 Erythema intertrigo: Secondary | ICD-10-CM

## 2018-08-07 DIAGNOSIS — E782 Mixed hyperlipidemia: Secondary | ICD-10-CM

## 2018-08-07 MED ORDER — ROSUVASTATIN CALCIUM 40 MG PO TABS: 40.0000 mg | ORAL_TABLET | Freq: Every day | ORAL | 3 refills | Status: DC

## 2018-08-07 MED ORDER — EZETIMIBE 10 MG PO TABS: 10.0000 mg | ORAL_TABLET | Freq: Every day | ORAL | 3 refills | Status: DC

## 2018-08-07 NOTE — Patient Instructions (Addendum)
-   Please call and tell us if you have been taking your lisinopril or not. If you are we will re-order it, if not we will hold off and just continue to monitor your blood pressure.   - Please send in cologuard this week.   Bad cholesterol, also called low-density lipoprotein (LDL), carries cholesterol and other fats that your liver makes to your body tissue. If it builds up in blood vessels, LDL can cause heart disease and other health problems. Your LDL level should be below 100. If you have diabetes or a possible heart problem, your LDL should be below 70.  Eat: Eat 20 to 30 grams of soluble fiber every day. Foods such as fruits and vegetables, whole grains, beans, peas, nuts, and seeds can help lower LDL. Avoid: Saturated fats (Dairy foods - such as butter, cream, ghee, regular-fat milk and cheese. Meat - such as fatty cuts of beef, pork and lamb, processed meats like salami, sausages and the skin on chicken. Lard., fatty snack foods, cakes, biscuits, pies and deep fried foods) Avoid smoking

## 2018-08-07 NOTE — Progress Notes (Addendum)
Name: Sarah Gillespie   MRN: [id number]    DOB: 1950-11-19   Date:08/07/2018       Progress Note  Subjective  Chief Complaint  Chief Complaint  Patient presents with  . Follow-up    HPI  Essential hypertension Patient is rx lisinopril 10mg  daily but she is unsure if she is taking it daily; last refilled in 2017 for a year suppy.  BP Readings from Last 3 Encounters:  08/07/18 124/78  07/23/18 136/74  04/09/18 124/82   Denies blurry vision, chest pain, dizziness, lightheadedness  Atherosclerosis of right carotid artery Patient is taking rosuvastatin 40mg  and zetia 10mg  a few times a week, states has trouble remembering to take meds daily.  Lab Results  Component Value Date   CHOL 182 02/05/2018   HDL 68 02/05/2018   LDLCALC 98 02/05/2018   TRIG 71 02/05/2018   CHOLHDL 2.7 02/05/2018    CKD (chronic kidney disease) stage 2, GFR 60-89 ml/min Drinks more water now 2-3 bottles a day. Uses acetaminophen if needed for pain   Mixed hyperlipidemia Patient is taking rosuvastatin 40mg  and zetia 10mg  daily BP Readings from Last 3 Encounters:  08/07/18 124/78  07/23/18 136/74  04/09/18 124/82    Major depressive disorder, recurrent episode, moderate  Patient is taking effexor 37.5mg  daily    PHQ2/9: Depression screen [facility] 2/9 08/07/2018 07/23/2018 04/09/2018 02/05/2018 02/05/2018  Decreased Interest 0 0 0 0 0  Down, Depressed, Hopeless 0 0 0 0 0  PHQ - 2 Score 0 0 0 0 0  Altered sleeping 0 0 0 0 -  Tired, decreased energy 0 0 0 0 -  Change in appetite 0 0 0 0 -  Feeling bad or failure about yourself  0 0 0 0 -  Trouble concentrating 0 0 0 0 -  Moving slowly or fidgety/restless 0 0 0 0 -  Suicidal thoughts 0 0 0 0 -  PHQ-9 Score 0 0 0 0 -  Difficult doing work/chores Not difficult at all Not difficult at all Not difficult at all Not difficult at all -  Some recent data might be hidden     PHQ reviewed. Positive  Patient Active Problem List   Diagnosis Date Noted  . DNR no  code (do not resuscitate) 04/05/2017  . Mild concentric left ventricular hypertrophy (LVH) 03/20/2017  . Grade I diastolic dysfunction 57/84/6962  . Cardiac murmur 03/05/2017  . CKD (chronic kidney disease) stage 2, GFR 60-89 ml/min 11/02/2016  . Knee sprain and strain 11/08/2015  . Sciatica 10/08/2015  . Postmenopausal 12/16/2014  . Trochanteric bursitis of both hips 12/07/2014  . Hyperlipidemia   . Depression   . Vitamin B12 deficiency   . Vitamin D deficiency disease   . Carotid atherosclerosis   . Essential hypertension, benign   . Major depressive disorder, recurrent episode, moderate ([place]) 12/10/2013  . Other chest pain 12/10/2013  . Sigmoid diverticulosis 12/04/2013    Past Medical History:  Diagnosis Date  . Angular cheilitis   . Anxiety   . Carotid atherosclerosis   . Carotid bruit   . Depression   . Elevated blood pressure   . Frequency   . Gross hematuria   . Hyperlipidemia   . Hypertension   . Nocturia   . Pyuria   . Sigmoid diverticulosis Oct. 2015   CT scan  . Vitamin B12 deficiency   . Vitamin D deficiency disease     Past Surgical History:  Procedure Laterality Date  . ABDOMINAL HYSTERECTOMY  1987  . APPENDECTOMY  1960  . CARPAL TUNNEL RELEASE Left 1988  . EXCISION NEUROMA Left 1988  . TONSILLECTOMY  1962    Social History   Tobacco Use  . Smoking status: Never Smoker  . Smokeless tobacco: Never Used  Substance Use Topics  . Alcohol use: Yes    Comment: occasional wine     Current Outpatient Medications:  .  aspirin EC 81 MG tablet, Take 81 mg by mouth daily., Disp: , Rfl:  .  CALCIUM PO, Take 500 mg by mouth daily. , Disp: , Rfl:  .  Cholecalciferol (VITAMIN D) 2000 UNITS CAPS, Take 2,000 Units by mouth daily., Disp: , Rfl:  .  ezetimibe (ZETIA) 10 MG tablet, Take 1 tablet (10 mg total) by mouth daily., Disp: 30 tablet, Rfl: 11 .  hydrocortisone 1 % ointment, Apply 1 application topically 2 (two) times daily. Can apply small amount  with ketoconazole cream., Disp: 30 g, Rfl: 0 .  ketoconazole (NIZORAL) 2 % cream, Apply 1 application topically 2 (two) times daily. Apply twice daily to dry skin, Disp: 15 g, Rfl: 0 .  lisinopril (PRINIVIL,ZESTRIL) 10 MG tablet, Take 1 tablet (10 mg total) by mouth daily. For blood pressure, Disp: 90 tablet, Rfl: 3 .  rosuvastatin (CRESTOR) 40 MG tablet, Take 1 tablet (40 mg total) by mouth daily., Disp: 30 tablet, Rfl: 11 .  venlafaxine XR (EFFEXOR-XR) 37.5 MG 24 hr capsule, TAKE 1 CAPSULE (37.5 MG TOTAL) BY MOUTH DAILY WITH BREAKFAST., Disp: 90 capsule, Rfl: 3 .  vitamin B-12 (CYANOCOBALAMIN) 1000 MCG tablet, Take 1 tablet by mouth daily., Disp: , Rfl:   No Known Allergies  Review of Systems  Constitutional: Negative for chills, fever and malaise/fatigue.  Respiratory: Negative for cough and shortness of breath.   Cardiovascular: Negative for chest pain, palpitations and leg swelling.  Gastrointestinal: Negative for abdominal pain.  Musculoskeletal: Negative for joint pain and myalgias.  Skin: Negative for rash.  Neurological: Negative for dizziness and headaches.  Psychiatric/Behavioral: The patient is not nervous/anxious and does not have insomnia.       No other specific complaints in a complete review of systems (except as listed in HPI above).  Objective  Vitals:   08/07/18 1020  BP: 124/78  Pulse: 83  Resp: 14  Temp: 98.3 F (36.8 C)  TempSrc: Oral  SpO2: 96%  Weight: 157 lb 14.4 oz (71.6 kg)  Height: 4\' 11"  (1.499 m)    Body mass index is 31.89 kg/m.  Nursing Note and Vital Signs reviewed.  Physical Exam Vitals signs reviewed.  Constitutional:      Appearance: She is well-developed.  HENT:     Head: Normocephalic and atraumatic.  Neck:     Musculoskeletal: Normal range of motion and neck supple.     Vascular: No carotid bruit.  Cardiovascular:     Heart sounds: Normal heart sounds.  Pulmonary:     Effort: Pulmonary effort is normal.     Breath  sounds: Normal breath sounds.  Abdominal:     General: Bowel sounds are normal.     Palpations: Abdomen is soft.     Tenderness: There is no abdominal tenderness.  Musculoskeletal: Normal range of motion.  Skin:    General: Skin is warm and dry.     Capillary Refill: Capillary refill takes less than 2 seconds.  Neurological:     Mental Status: She is alert and oriented to person, place, and time.     GCS: GCS  eye subscore is 4. GCS verbal subscore is 5. GCS motor subscore is 6.     Sensory: No sensory deficit.  Psychiatric:        Speech: Speech normal.        Behavior: Behavior normal.        Thought Content: Thought content normal.        Judgment: Judgment normal.        No results found for this or any previous visit (from the past 48 hour(s)).  Assessment & Plan  1. Essential hypertension, benign Stable, will tell us If she has been taking lisinopril   2. Atherosclerosis of right carotid artery BP and lipid control; set alarm on her phone with her to remember to take meds at night - Lipid Profile - ezetimibe (ZETIA) 10 MG tablet; Take 1 tablet (10 mg total) by mouth daily.  Dispense: 90 tablet; Refill: 3 - rosuvastatin (CRESTOR) 40 MG tablet; Take 1 tablet (40 mg total) by mouth daily.  Dispense: 90 tablet; Refill: 3 3. CKD (chronic kidney disease) stage 2, GFR 60-89 ml/min Drink waters avoid NSAIDs - COMPLETE METABOLIC PANEL WITH GFR  4. Mixed hyperlipidemia Goal LDL under 70  - Lipid Profile - ezetimibe (ZETIA) 10 MG tablet; Take 1 tablet (10 mg total) by mouth daily.  Dispense: 90 tablet; Refill: 3 - rosuvastatin (CRESTOR) 40 MG tablet; Take 1 tablet (40 mg total) by mouth daily.  Dispense: 90 tablet; Refill: 3 5. Major depressive disorder, recurrent episode, moderate (HCC) Well controlled Continue meds  6. Medication monitoring encounter - COMPLETE METABOLIC PANEL WITH GFR  7. Intertrigo resolved

## 2018-08-07 NOTE — Addendum Note (Signed)
Addended by: Fredderick Severance on: 08/07/2018 10:55 AM   Modules accepted: Orders

## 2018-08-08 LAB — COMPLETE METABOLIC PANEL WITH GFR
AG Ratio: 1.6 (calc) (ref 1.0–2.5)
ALT: 12 U/L (ref 6–29)
AST: 18 U/L (ref 10–35)
Albumin: 3.9 g/dL (ref 3.6–5.1)
Alkaline phosphatase (APISO): 84 U/L (ref 37–153)
BUN: 17 mg/dL (ref 7–25)
CO2: 23 mmol/L (ref 20–32)
Calcium: 9.2 mg/dL (ref 8.6–10.4)
Chloride: 108 mmol/L (ref 98–110)
Creat: 0.91 mg/dL (ref 0.50–0.99)
GFR, Est African American: 75 mL/min/{1.73_m2} (ref >=60)
GFR, Est Non African American: 64 mL/min/{1.73_m2} (ref >=60)
Globulin: 2.4 g/dL (calc) (ref 1.9–3.7)
Glucose, Bld: 106 mg/dL — ABNORMAL HIGH (ref 65–99)
Potassium: 4.4 mmol/L (ref 3.5–5.3)
Sodium: 139 mmol/L (ref 135–146)
Total Bilirubin: 0.4 mg/dL (ref 0.2–1.2)
Total Protein: 6.3 g/dL (ref 6.1–8.1)

## 2018-08-08 LAB — LIPID PANEL
Cholesterol: 174 mg/dL (ref <200)
HDL: 54 mg/dL (ref >=50)
LDL Cholesterol (Calc): 102 mg/dL (calc) — ABNORMAL HIGH
Non-HDL Cholesterol (Calc): 120 mg/dL (calc) (ref <130)
Total CHOL/HDL Ratio: 3.2 (calc) (ref <5.0)
Triglycerides: 87 mg/dL (ref <150)

## 2018-09-03 NOTE — Telephone Encounter (Signed)
lvm to schedule appt for patient

## 2018-10-13 DIAGNOSIS — S92514A Nondisplaced fracture of proximal phalanx of right lesser toe(s), initial encounter for closed fracture: Secondary | ICD-10-CM | POA: Diagnosis not present

## 2018-10-13 DIAGNOSIS — S99921A Unspecified injury of right foot, initial encounter: Secondary | ICD-10-CM | POA: Diagnosis not present

## 2018-10-17 ENCOUNTER — Telehealth: Payer: Self-pay | Admitting: Nurse Practitioner

## 2018-10-17 NOTE — Telephone Encounter (Signed)
Copied from [place] [id number]. Topic: Referral - Request for Referral >> Oct 17, 2018  3:32 PM Leward Quan A wrote: Has patient seen PCP for this complaint? Yes.   *If NO, is insurance requiring patient see PCP for this issue before PCP can refer them? Referral for which specialty: Orthopedic Preferred provider/office: [facility] in [place] Fax# [phone]   Reason for referral: fall fractured toes   Appointment scheduled for 10/21/2018 at 10.00 am  please call patient when done

## 2018-10-18 NOTE — Telephone Encounter (Signed)
I contacted this patient to inform her of Sarah Gillespie's message and she stated that she has an appt with [facility] on Monday but needs a referral. I told her that since there is nothing documented in her chart regarding the fractured toe that we cannot make the referral without her being seen either via virtual or office visit. She said that she was seen at the urgent care and they were the ones that sent her to [facility]. I informed her that since they actually saw her then they would have to be the ones supplying the notes and referral. She said ok.

## 2018-10-18 NOTE — Telephone Encounter (Signed)
Please advise 

## 2018-10-18 NOTE — Telephone Encounter (Signed)
She can go to their walk in clinic Mon-Fri 1p-7p at [facility].  I don't see documentation in the chart that she had fractured any toes recently.

## 2018-10-21 DIAGNOSIS — S92414A Nondisplaced fracture of proximal phalanx of right great toe, initial encounter for closed fracture: Secondary | ICD-10-CM | POA: Diagnosis not present

## 2018-10-21 DIAGNOSIS — S92511A Displaced fracture of proximal phalanx of right lesser toe(s), initial encounter for closed fracture: Secondary | ICD-10-CM | POA: Diagnosis not present

## 2018-11-12 DIAGNOSIS — S92414D Nondisplaced fracture of proximal phalanx of right great toe, subsequent encounter for fracture with routine healing: Secondary | ICD-10-CM | POA: Diagnosis not present

## 2018-11-13 ENCOUNTER — Telehealth: Payer: Self-pay | Admitting: Family Medicine

## 2018-11-13 NOTE — Chronic Care Management (AMB) (Signed)
°  Chronic Care Management   Outreach Note  11/13/2018 Name: Sarah Gillespie MRN: [id number] DOB: Jul 11, 1950  Referred by: Arnetha Courser, MD Reason for referral : Chronic Care Management (Initial CCM outreach was unsuccessful. )   An unsuccessful telephone outreach was attempted today. The patient was referred to the case management team by for assistance with chronic care management and care coordination.   Follow Up Plan: A HIPPA compliant phone message was left for the patient providing contact information and requesting a return call.  The care management team will reach out to the patient again over the next 7 days.  If patient returns call to provider office, please advise to call [place] at [place]  [email or web address]   ??[phone]

## 2018-11-19 NOTE — Chronic Care Management (AMB) (Signed)
°  Chronic Care Management   Outreach Note  11/19/2018 Name: Sarah Gillespie MRN: [id number] DOB: May 06, 1950  Referred by: Arnetha Courser, MD Reason for referral : Chronic Care Management (Initial CCM outreach was unsuccessful. ) and Chronic Care Management (Second CCM outreach was unsuccessful.)   A second unsuccessful telephone outreach was attempted today. The patient was referred to the case management team for assistance with chronic care management and care coordination.   Follow Up Plan: A HIPPA compliant phone message was left for the patient providing contact information and requesting a return call.  The care management team will reach out to the patient again over the next 7 days.  If patient returns call to provider office, please advise to call [place] at [place]  [email or web address]   ??[phone]

## 2018-11-27 NOTE — Chronic Care Management (AMB) (Signed)
°  Chronic Care Management   Outreach Note  11/27/2018 Name: Sarah Gillespie MRN: [id number] DOB: 1950/04/01  Referred by: Arnetha Courser, MD Reason for referral : Chronic Care Management (Initial CCM outreach was unsuccessful. ), Chronic Care Management (Second CCM outreach was unsuccessful.), and Chronic Care Management (Third CCM outreach was unsuccessful. )   Third unsuccessful telephone outreach was attempted today. The patient was referred to the case management team for assistance with chronic care management and care coordination. The patient's primary care provider has been notified of our unsuccessful attempts to make or maintain contact with the patient. The care management team is pleased to engage with this patient at any time in the future should he/she be interested in assistance from the care management team.   Follow Up Plan: The care management team is available to follow up with the patient after provider conversation with the patient regarding recommendation for care management engagement and subsequent re-referral to the care management team.   [place]  [email or web address]   ??[phone]

## 2019-02-07 ENCOUNTER — Ambulatory Visit (INDEPENDENT_AMBULATORY_CARE_PROVIDER_SITE_OTHER): Payer: Medicare HMO | Admitting: Family Medicine

## 2019-02-07 ENCOUNTER — Other Ambulatory Visit: Payer: Self-pay

## 2019-02-07 ENCOUNTER — Encounter: Payer: Self-pay | Admitting: Family Medicine

## 2019-02-07 VITALS — BP 118/82 | HR 87 | Temp 97.9°F | Resp 14 | Ht 59.0 in | Wt 159.2 lb

## 2019-02-07 DIAGNOSIS — Z23 Encounter for immunization: Secondary | ICD-10-CM

## 2019-02-07 DIAGNOSIS — E559 Vitamin D deficiency, unspecified: Secondary | ICD-10-CM | POA: Diagnosis not present

## 2019-02-07 DIAGNOSIS — Z6832 Body mass index (BMI) 32.0-32.9, adult: Secondary | ICD-10-CM

## 2019-02-07 DIAGNOSIS — E669 Obesity, unspecified: Secondary | ICD-10-CM | POA: Diagnosis not present

## 2019-02-07 DIAGNOSIS — I10 Essential (primary) hypertension: Secondary | ICD-10-CM | POA: Diagnosis not present

## 2019-02-07 DIAGNOSIS — E782 Mixed hyperlipidemia: Secondary | ICD-10-CM | POA: Diagnosis not present

## 2019-02-07 DIAGNOSIS — F331 Major depressive disorder, recurrent, moderate: Secondary | ICD-10-CM

## 2019-02-07 DIAGNOSIS — Z5181 Encounter for therapeutic drug level monitoring: Secondary | ICD-10-CM | POA: Diagnosis not present

## 2019-02-07 DIAGNOSIS — N182 Chronic kidney disease, stage 2 (mild): Secondary | ICD-10-CM | POA: Diagnosis not present

## 2019-02-07 DIAGNOSIS — E538 Deficiency of other specified B group vitamins: Secondary | ICD-10-CM | POA: Diagnosis not present

## 2019-02-07 NOTE — Progress Notes (Signed)
Name: Sarah Gillespie   MRN: [id number]    DOB: 09/11/1950   Date:02/07/2019       Progress Note  Chief Complaint  Patient presents with  . Follow-up    pt states has been forgetting to take meds  . Hypertension  . Depression  . Hyperlipidemia     Subjective:   Sarah Gillespie is a 68 y.o. female, presents to clinic for routine follow up on the conditions listed above.  Hypertension:  Currently managed on lisinopril 10 mg  Pt reports poor med compliance and denies any SE No lightheadedness, hypotension, syncope. Blood pressure today is well controlled. BP Readings from Last 3 Encounters:  02/07/19 118/82  08/07/18 124/78  07/23/18 136/74  Pt denies CP, SOB, exertional sx, LE edema, palpitation, Ha's, visual disturbances  Hyperlipidemia: Current Medication Regimen:  Couple months not taking zetia and not taking crestor - over months she finally admits during visit that she will take only a few times a week or month even with alarms set - which were set up at last OV to try and remind her, shes asked friends to remind her, she also meds on her nightstand by her bed, but she just forgets to take at night.  When she does take she has not noticed any SE, and has no concerns Last Lipids: Lab Results  Component Value Date   CHOL 174 08/07/2018   HDL 54 08/07/2018   LDLCALC 102 (H) 08/07/2018   TRIG 87 08/07/2018   CHOLHDL 3.2 08/07/2018    - Denies: Chest pain, shortness of breath, myalgias.  She sees cardiology, Dr. Ubaldo Glassing for LVH, some grade 1 diastolic dysfunction HF  Kidney function has been relatively good with CKD stage 2 Lab Results  Component Value Date   CREATININE 0.91 08/07/2018   Lab Results  Component Value Date   BUN 17 08/07/2018   Lab Results  Component Value Date   GFRNONAA 64 08/07/2018   Vit D and Vit B-12 deficiency: Lab Results  Component Value Date   [id number] 08/03/2017   Lab Results  Component Value Date   VD25OH 23 (L) 08/03/2017   Her daily supplement dose was increased from 2000 IU daily to 5000 IU daily last year by Dr. Gemma Payor Vit D today - again poor med compliance   Obesity BMI 32 - weight up only a few lbs since last year, weight has been stable over the past 6 months Pt reports eating better/healthier, buying more fresh food, less fast food Wt Readings from Last 5 Encounters:  02/07/19 159 lb 3.2 oz (72.2 kg)  08/07/18 157 lb 14.4 oz (71.6 kg)  07/23/18 158 lb 1.6 oz (71.7 kg)  04/09/18 152 lb 12.8 oz (69.3 kg)  02/05/18 155 lb 4.8 oz (70.4 kg)   BMI Readings from Last 5 Encounters:  02/07/19 32.15 kg/m  08/07/18 31.89 kg/m  07/23/18 31.93 kg/m  04/09/18 30.86 kg/m  02/05/18 31.37 kg/m   MDD:  In remission, pt has been off meds, she notes improved mood when she is busy with making crafts and selling them, mood good, PHQ neg Depression screen [facility] 2/9 02/07/2019 08/07/2018 07/23/2018  Decreased Interest 0 0 0  Down, Depressed, Hopeless 0 0 0  PHQ - 2 Score 0 0 0  Altered sleeping 0 0 0  Tired, decreased energy 0 0 0  Change in appetite 0 0 0  Feeling bad or failure about yourself  0 0 0  Trouble concentrating 0 0 0  Moving slowly or fidgety/restless 0 0 0  Suicidal thoughts 0 0 0  PHQ-9 Score 0 0 0  Difficult doing work/chores Not difficult at all Not difficult at all Not difficult at all  Some recent data might be hidden    Patient Active Problem List   Diagnosis Date Noted  . DNR no code (do not resuscitate) 04/05/2017  . Mild concentric left ventricular hypertrophy (LVH) 03/20/2017  . Grade I diastolic dysfunction 123456  . Cardiac murmur 03/05/2017  . CKD (chronic kidney disease) stage 2, GFR 60-89 ml/min 11/02/2016  . Knee sprain and strain 11/08/2015  . Sciatica 10/08/2015  . Postmenopausal 12/16/2014  . Trochanteric bursitis of both hips 12/07/2014  . Hyperlipidemia   . Depression   . Vitamin B12 deficiency   . Vitamin D deficiency disease   . Carotid atherosclerosis    . Essential hypertension, benign   . Major depressive disorder, recurrent episode, moderate ([place]) 12/10/2013  . Other chest pain 12/10/2013  . Sigmoid diverticulosis 12/04/2013    Past Surgical History:  Procedure Laterality Date  . ABDOMINAL HYSTERECTOMY  1987  . APPENDECTOMY  1960  . CARPAL TUNNEL RELEASE Left 1988  . EXCISION NEUROMA Left 1988  . TONSILLECTOMY  1962    Family History  Problem Relation Age of Onset  . Hypertension Mother   . Heart disease Mother        CHF for months, aortic stenosis for years  . Clotting disorder Mother   . Aortic stenosis Mother   . Heart failure Mother   . Depression Mother   . Deep vein thrombosis Mother   . Other Sister        muscle disease, polymyositis  . Heart failure Sister   . Pneumonia Sister        cause of death  . ALS Father   . Urolithiasis Father   . Thyroid disease Father   . CVA Father   . Thyroid disease Sister   . Clotting disorder Sister   . Hyperlipidemia Sister   . Migraines Sister   . Deep vein thrombosis Sister   . Cancer Brother 50       lung, brain cancer  . Diabetes Maternal Grandmother   . Stroke Brother   . Heart murmur Brother        rheumatic fever    Social History   Socioeconomic History  . Marital status: Divorced    Spouse name: Not on file  . Number of children: 2  . Years of education: Not on file  . Highest education level: Some college, no degree  Occupational History  . Occupation: retired  Scientific laboratory technician  . Financial resource strain: Not hard at all  . Food insecurity    Worry: Never true    Inability: Never true  . Transportation needs    Medical: No    Non-medical: No  Tobacco Use  . Smoking status: Never Smoker  . Smokeless tobacco: Never Used  Substance and Sexual Activity  . Alcohol use: Yes    Comment: occasional wine  . Drug use: No  . Sexual activity: Not Currently    Partners: Male  Lifestyle  . Physical activity    Days per week: 0 days    Minutes per  session: 0 min  . Stress: Not at all  Relationships  . Social connections    Talks on phone: More than three times a week    Gets together: Three times a week  Attends religious service: Never    Active member of club or organization: No    Attends meetings of clubs or organizations: Never    Relationship status: Divorced  . Intimate partner violence    Fear of current or ex partner: No    Emotionally abused: No    Physically abused: No    Forced sexual activity: No  Other Topics Concern  . Not on file  Social History Narrative  . Not on file     Current Outpatient Medications:  .  aspirin EC 81 MG tablet, Take 81 mg by mouth daily., Disp: , Rfl:  .  CALCIUM PO, Take 500 mg by mouth daily. , Disp: , Rfl:  .  Cholecalciferol (VITAMIN D) 2000 UNITS CAPS, Take 2,000 Units by mouth daily., Disp: , Rfl:  .  ezetimibe (ZETIA) 10 MG tablet, Take 1 tablet (10 mg total) by mouth daily., Disp: 90 tablet, Rfl: 3 .  hydrocortisone 1 % ointment, Apply 1 application topically 2 (two) times daily. Can apply small amount with ketoconazole cream., Disp: 30 g, Rfl: 0 .  ketoconazole (NIZORAL) 2 % cream, Apply 1 application topically 2 (two) times daily. Apply twice daily to dry skin, Disp: 15 g, Rfl: 0 .  lisinopril (PRINIVIL,ZESTRIL) 10 MG tablet, Take 1 tablet (10 mg total) by mouth daily. For blood pressure, Disp: 90 tablet, Rfl: 3 .  rosuvastatin (CRESTOR) 40 MG tablet, Take 1 tablet (40 mg total) by mouth daily., Disp: 90 tablet, Rfl: 3 .  venlafaxine XR (EFFEXOR-XR) 37.5 MG 24 hr capsule, TAKE 1 CAPSULE (37.5 MG TOTAL) BY MOUTH DAILY WITH BREAKFAST., Disp: 90 capsule, Rfl: 3 .  vitamin B-12 (CYANOCOBALAMIN) 1000 MCG tablet, Take 1 tablet by mouth daily., Disp: , Rfl:   No Known Allergies  I personally reviewed active problem list, medication list, allergies, family history, social history, health maintenance, notes from last encounter, lab results, imaging with the patient/caregiver today.    Review of Systems  Constitutional: Negative.   HENT: Negative.   Eyes: Negative.   Respiratory: Negative.   Cardiovascular: Negative.   Gastrointestinal: Negative.   Endocrine: Negative.   Genitourinary: Negative.   Musculoskeletal: Negative.   Skin: Negative.   Allergic/Immunologic: Negative.   Neurological: Negative.   Hematological: Negative.   Psychiatric/Behavioral: Negative.   All other systems reviewed and are negative.    Objective:    Vitals:   02/07/19 1009  BP: 118/82  Pulse: 87  Resp: 14  Temp: 97.9 F (36.6 C)  SpO2: 98%  Weight: 159 lb 3.2 oz (72.2 kg)  Height: 4\' 11"  (1.499 m)    Body mass index is 32.15 kg/m.  Physical Exam Vitals signs and nursing note reviewed.  Constitutional:      General: She is not in acute distress.    Appearance: Normal appearance. She is well-developed. She is not ill-appearing, toxic-appearing or diaphoretic.     Interventions: Face mask in place.  HENT:     Head: Normocephalic and atraumatic.     Right Ear: External ear normal.     Left Ear: External ear normal.  Eyes:     General: Lids are normal. No scleral icterus.       Right eye: No discharge.        Left eye: No discharge.     Conjunctiva/sclera: Conjunctivae normal.  Neck:     Musculoskeletal: Normal range of motion and neck supple.     Trachea: Phonation normal. No tracheal deviation.  Cardiovascular:  Rate and Rhythm: Normal rate and regular rhythm.     Pulses: Normal pulses.          Radial pulses are 2+ on the right side and 2+ on the left side.       Posterior tibial pulses are 2+ on the right side and 2+ on the left side.     Heart sounds: Normal heart sounds. No murmur. No friction rub. No gallop.   Pulmonary:     Effort: Pulmonary effort is normal. No respiratory distress.     Breath sounds: Normal breath sounds. No stridor. No wheezing, rhonchi or rales.  Chest:     Chest wall: No tenderness.  Abdominal:     General: Bowel sounds are  normal. There is no distension.     Palpations: Abdomen is soft.     Tenderness: There is no abdominal tenderness. There is no guarding or rebound.  Musculoskeletal: Normal range of motion.        General: No deformity.     Right lower leg: No edema.     Left lower leg: No edema.  Lymphadenopathy:     Cervical: No cervical adenopathy.  Skin:    General: Skin is warm and dry.     Capillary Refill: Capillary refill takes less than 2 seconds.     Coloration: Skin is not jaundiced or pale.     Findings: No rash.  Neurological:     Mental Status: She is alert and oriented to person, place, and time.     Motor: No abnormal muscle tone.     Gait: Gait normal.  Psychiatric:        Speech: Speech normal.        Behavior: Behavior normal.      No results found for this or any previous visit (from the past 2160 hour(s)).   Fall Risk: Fall Risk  02/07/2019 08/07/2018 07/23/2018 04/09/2018 02/05/2018  Falls in the past year? 1 0 0 0 0  Number falls in past yr: 1 - 0 0 0  Injury with Fall? 1 - 0 0 -  Follow up - - - Falls prevention discussed -    Functional Status Survey: Is the patient deaf or have difficulty hearing?: No Does the patient have difficulty seeing, even when wearing glasses/contacts?: No Does the patient have difficulty concentrating, remembering, or making decisions?: No Does the patient have difficulty walking or climbing stairs?: No Does the patient have difficulty dressing or bathing?: No Does the patient have difficulty doing errands alone such as visiting a doctor's office or shopping?: No   Assessment & Plan:   1. Essential hypertension, benign Stable, well controlled even with poor med compliance, improved lifestyle - CMP w GFR  2. CKD (chronic kidney disease) stage 2, GFR 60-89 ml/min recheck - CMP w GFR  3. Mixed hyperlipidemia Not taking meds most days, and not for the last month +   Set more reminders on the patients phone - encouraged her to take in the  morning if it helps her remember - Lipid Panel - CMP w GFR  4. Vitamin B12 deficiency Recheck since off most meds/supplements - CBC w/ Diff - Vitamin B12  5. Vitamin D deficiency disease Recheck since off most meds/supplements - Vit D  6. Major depressive disorder, recurrent episode, moderate ([facility]) Well controlled currently w/o meds - in remission  7. Class 1 obesity with serious comorbidity and body mass index (BMI) of 32.0 to 32.9 in adult, unspecified obesity type  Weight stable - up a little bit, but pt notes improved diet, discussed reduced portions/calories and increased exercise for weight and overall cardiovascular health  8. Encounter for medication monitoring - Vit D - Lipid Panel - CMP w GFR - CBC w/ Diff - Vitamin B12    Return in about 6 months (around 08/08/2019) for Routine follow-up.   Delsa Grana, PA-C 02/07/19 10:17 AM

## 2019-02-07 NOTE — Patient Instructions (Addendum)
You can start to cut your blood pressure medicine, lisinopril in half - that will be 5 mg dose daily   I would recommend trying to remember the B-12, Vit D, and crestor if you were going to do the most important meds.

## 2019-02-08 LAB — VITAMIN D 25 HYDROXY (VIT D DEFICIENCY, FRACTURES): Vit D, 25-Hydroxy: 17 ng/mL — ABNORMAL LOW (ref 30–100)

## 2019-02-08 LAB — COMPLETE METABOLIC PANEL WITH GFR
AG Ratio: 1.7 (calc) (ref 1.0–2.5)
ALT: 15 U/L (ref 6–29)
AST: 20 U/L (ref 10–35)
Albumin: 4.2 g/dL (ref 3.6–5.1)
Alkaline phosphatase (APISO): 91 U/L (ref 37–153)
BUN/Creatinine Ratio: 13 (calc) (ref 6–22)
BUN: 13 mg/dL (ref 7–25)
CO2: 24 mmol/L (ref 20–32)
Calcium: 9.5 mg/dL (ref 8.6–10.4)
Chloride: 106 mmol/L (ref 98–110)
Creat: 0.98 mg/dL — ABNORMAL HIGH (ref 0.60–0.93)
GFR, Est African American: 68 mL/min/{1.73_m2} (ref >=60)
GFR, Est Non African American: 58 mL/min/{1.73_m2} — ABNORMAL LOW (ref >=60)
Globulin: 2.5 g/dL (calc) (ref 1.9–3.7)
Glucose, Bld: 95 mg/dL (ref 65–99)
Potassium: 4.4 mmol/L (ref 3.5–5.3)
Sodium: 140 mmol/L (ref 135–146)
Total Bilirubin: 0.4 mg/dL (ref 0.2–1.2)
Total Protein: 6.7 g/dL (ref 6.1–8.1)

## 2019-02-08 LAB — CBC WITH DIFFERENTIAL/PLATELET
Absolute Monocytes: 463 cells/uL (ref 200–950)
Basophils Absolute: 62 cells/uL (ref 0–200)
Basophils Relative: 1.5 %
Eosinophils Absolute: 172 cells/uL (ref 15–500)
Eosinophils Relative: 4.2 %
HCT: 43.4 % (ref 35.0–45.0)
Hemoglobin: 14.3 g/dL (ref 11.7–15.5)
Lymphs Abs: 873 cells/uL (ref 850–3900)
MCH: 30.8 pg (ref 27.0–33.0)
MCHC: 32.9 g/dL (ref 32.0–36.0)
MCV: 93.3 fL (ref 80.0–100.0)
MPV: 10.9 fL (ref 7.5–12.5)
Monocytes Relative: 11.3 %
Neutro Abs: 2530 cells/uL (ref 1500–7800)
Neutrophils Relative %: 61.7 %
Platelets: 322 10*3/uL (ref 140–400)
RBC: 4.65 10*6/uL (ref 3.80–5.10)
RDW: 14.4 % (ref 11.0–15.0)
Total Lymphocyte: 21.3 %
WBC: 4.1 10*3/uL (ref 3.8–10.8)

## 2019-02-08 LAB — LIPID PANEL
Cholesterol: 236 mg/dL — ABNORMAL HIGH (ref <200)
HDL: 56 mg/dL (ref >=50)
LDL Cholesterol (Calc): 155 mg/dL (calc) — ABNORMAL HIGH
Non-HDL Cholesterol (Calc): 180 mg/dL (calc) — ABNORMAL HIGH (ref <130)
Total CHOL/HDL Ratio: 4.2 (calc) (ref <5.0)
Triglycerides: 124 mg/dL (ref <150)

## 2019-02-08 LAB — VITAMIN B12: Vitamin B-12: 611 pg/mL (ref 200–1100)

## 2019-04-09 ENCOUNTER — Encounter: Payer: Self-pay | Admitting: Family Medicine

## 2019-04-17 ENCOUNTER — Ambulatory Visit (INDEPENDENT_AMBULATORY_CARE_PROVIDER_SITE_OTHER): Payer: Medicare HMO

## 2019-04-17 ENCOUNTER — Other Ambulatory Visit: Payer: Self-pay

## 2019-04-17 VITALS — BP 138/84 | HR 84 | Temp 96.8°F | Resp 16 | Ht 59.0 in | Wt 155.6 lb

## 2019-04-17 DIAGNOSIS — Z0000 Encounter for general adult medical examination without abnormal findings: Secondary | ICD-10-CM

## 2019-04-17 DIAGNOSIS — Z780 Asymptomatic menopausal state: Secondary | ICD-10-CM | POA: Diagnosis not present

## 2019-04-17 DIAGNOSIS — Z1231 Encounter for screening mammogram for malignant neoplasm of breast: Secondary | ICD-10-CM | POA: Diagnosis not present

## 2019-04-17 NOTE — Patient Instructions (Addendum)
Sarah Gillespie , Thank you for taking time to come for your Medicare Wellness Visit. I appreciate your ongoing commitment to your health goals. Please review the following plan we discussed and let me know if I can assist you in the future.   Screening recommendations/referrals: Colonoscopy: due - Please complete Cologuard test kit at home and send in Mammogram: Please call [phone] to schedule your mammogram and bone density screening.   Recommended yearly ophthalmology/optometry visit for glaucoma screening and checkup Recommended yearly dental visit for hygiene and checkup  Vaccinations: Influenza vaccine: done 02/07/19 Pneumococcal vaccine: done 02/02/17 Tdap vaccine: due Shingles vaccine: Shingrix discussed. Please contact your pharmacy for coverage information.   Advanced directives: Advance directive discussed with you today. I have provided a copy for you to complete at home and have notarized. Once this is complete please bring a copy in to our office so we can scan it into your chart.  Conditions/risks identified: Recommend increasing physical activity to at least 3 days per week.   Next appointment: Please follow up in one year for your Medicare Annual Wellness visit.     Preventive Care 71 Years and Older, Female Preventive care refers to lifestyle choices and visits with your health care provider that can promote health and wellness. What does preventive care include?  A yearly physical exam. This is also called an annual well check.  Dental exams once or twice a year.  Routine eye exams. Ask your health care provider how often you should have your eyes checked.  Personal lifestyle choices, including:  Daily care of your teeth and gums.  Regular physical activity.  Eating a healthy diet.  Avoiding tobacco and drug use.  Limiting alcohol use.  Practicing safe sex.  Taking low-dose aspirin every day.  Taking vitamin and mineral supplements as recommended  by your health care provider. What happens during an annual well check? The services and screenings done by your health care provider during your annual well check will depend on your age, overall health, lifestyle risk factors, and family history of disease. Counseling  Your health care provider may ask you questions about your:  Alcohol use.  Tobacco use.  Drug use.  Emotional well-being.  Home and relationship well-being.  Sexual activity.  Eating habits.  History of falls.  Memory and ability to understand (cognition).  Work and work Statistician.  Reproductive health. Screening  You may have the following tests or measurements:  Height, weight, and BMI.  Blood pressure.  Lipid and cholesterol levels. These may be checked every 5 years, or more frequently if you are over 84 years old.  Skin check.  Lung cancer screening. You may have this screening every year starting at age 60 if you have a 30-pack-year history of smoking and currently smoke or have quit within the past 15 years.  Fecal occult blood test (FOBT) of the stool. You may have this test every year starting at age 20.  Flexible sigmoidoscopy or colonoscopy. You may have a sigmoidoscopy every 5 years or a colonoscopy every 10 years starting at age 76.  Hepatitis C blood test.  Hepatitis B blood test.  Sexually transmitted disease (STD) testing.  Diabetes screening. This is done by checking your blood sugar (glucose) after you have not eaten for a while (fasting). You may have this done every 1-3 years.  Bone density scan. This is done to screen for osteoporosis. You may have this done starting at age 90.  Mammogram. This may be done  every 1-2 years. Talk to your health care provider about how often you should have regular mammograms. Talk with your health care provider about your test results, treatment options, and if necessary, the need for more tests. Vaccines  Your health care provider may  recommend certain vaccines, such as:  Influenza vaccine. This is recommended every year.  Tetanus, diphtheria, and acellular pertussis (Tdap, Td) vaccine. You may need a Td booster every 10 years.  Zoster vaccine. You may need this after age 69.  Pneumococcal 13-valent conjugate (PCV13) vaccine. One dose is recommended after age 37.  Pneumococcal polysaccharide (PPSV23) vaccine. One dose is recommended after age 70. Talk to your health care provider about which screenings and vaccines you need and how often you need them. This information is not intended to replace advice given to you by your health care provider. Make sure you discuss any questions you have with your health care provider. Document Released: 03/19/2015 Document Revised: 11/10/2015 Document Reviewed: 12/22/2014 Elsevier Interactive Patient Education  2017 [place] Prevention in the Home Falls can cause injuries. They can happen to people of all ages. There are many things you can do to make your home safe and to help prevent falls. What can I do on the outside of my home?  Regularly fix the edges of walkways and driveways and fix any cracks.  Remove anything that might make you trip as you walk through a door, such as a raised step or threshold.  Trim any bushes or trees on the path to your home.  Use bright outdoor lighting.  Clear any walking paths of anything that might make someone trip, such as rocks or tools.  Regularly check to see if handrails are loose or broken. Make sure that both sides of any steps have handrails.  Any raised decks and porches should have guardrails on the edges.  Have any leaves, snow, or ice cleared regularly.  Use sand or salt on walking paths during winter.  Clean up any spills in your garage right away. This includes oil or grease spills. What can I do in the bathroom?  Use night lights.  Install grab bars by the toilet and in the tub and shower. Do not use towel  bars as grab bars.  Use non-skid mats or decals in the tub or shower.  If you need to sit down in the shower, use a plastic, non-slip stool.  Keep the floor dry. Clean up any water that spills on the floor as soon as it happens.  Remove soap buildup in the tub or shower regularly.  Attach bath mats securely with double-sided non-slip rug tape.  Do not have throw rugs and other things on the floor that can make you trip. What can I do in the bedroom?  Use night lights.  Make sure that you have a light by your bed that is easy to reach.  Do not use any sheets or blankets that are too big for your bed. They should not hang down onto the floor.  Have a firm chair that has side arms. You can use this for support while you get dressed.  Do not have throw rugs and other things on the floor that can make you trip. What can I do in the kitchen?  Clean up any spills right away.  Avoid walking on wet floors.  Keep items that you use a lot in easy-to-reach places.  If you need to reach something above you, use a  strong step stool that has a grab bar.  Keep electrical cords out of the way.  Do not use floor polish or wax that makes floors slippery. If you must use wax, use non-skid floor wax.  Do not have throw rugs and other things on the floor that can make you trip. What can I do with my stairs?  Do not leave any items on the stairs.  Make sure that there are handrails on both sides of the stairs and use them. Fix handrails that are broken or loose. Make sure that handrails are as long as the stairways.  Check any carpeting to make sure that it is firmly attached to the stairs. Fix any carpet that is loose or worn.  Avoid having throw rugs at the top or bottom of the stairs. If you do have throw rugs, attach them to the floor with carpet tape.  Make sure that you have a light switch at the top of the stairs and the bottom of the stairs. If you do not have them, ask someone to  add them for you. What else can I do to help prevent falls?  Wear shoes that:  Do not have high heels.  Have rubber bottoms.  Are comfortable and fit you well.  Are closed at the toe. Do not wear sandals.  If you use a stepladder:  Make sure that it is fully opened. Do not climb a closed stepladder.  Make sure that both sides of the stepladder are locked into place.  Ask someone to hold it for you, if possible.  Clearly mark and make sure that you can see:  Any grab bars or handrails.  First and last steps.  Where the edge of each step is.  Use tools that help you move around (mobility aids) if they are needed. These include:  Canes.  Walkers.  Scooters.  Crutches.  Turn on the lights when you go into a dark area. Replace any light bulbs as soon as they burn out.  Set up your furniture so you have a clear path. Avoid moving your furniture around.  If any of your floors are uneven, fix them.  If there are any pets around you, be aware of where they are.  Review your medicines with your doctor. Some medicines can make you feel dizzy. This can increase your chance of falling. Ask your doctor what other things that you can do to help prevent falls. This information is not intended to replace advice given to you by your health care provider. Make sure you discuss any questions you have with your health care provider. Document Released: 12/17/2008 Document Revised: 07/29/2015 Document Reviewed: 03/27/2014 Elsevier Interactive Patient Education  2017 [employer].

## 2019-04-17 NOTE — Progress Notes (Signed)
Subjective:   Sarah Gillespie is a 69 y.o. female who presents for Medicare Annual (Subsequent) preventive examination.   Review of Systems:   Cardiac Risk Factors include: advanced age (>25mn, >>36women);dyslipidemia;hypertension;obesity (BMI >30kg/m2)     Objective:     Vitals: BP 138/84 (BP Location: Left Arm, Patient Position: Sitting, Cuff Size: Normal)   Pulse 84   Temp (!) 96.8 F (36 C) (Temporal)   Resp 16   Ht 4' 11"  (1.499 m)   Wt 155 lb 9.6 oz (70.6 kg)   SpO2 96%   BMI 31.43 kg/m   Body mass index is 31.43 kg/m.  Advanced Directives 04/17/2019 04/09/2018 11/02/2016 08/01/2016 01/31/2016 09/09/2015 07/30/2015  Does Patient Have a Medical Advance Directive? No No No No No No No  Would patient like information on creating a medical advance directive? Yes (MAU/Ambulatory/Procedural Areas - Information given) No - Patient declined - - - No - patient declined information No - patient declined information    Tobacco Social History   Tobacco Use  Smoking Status Never Smoker  Smokeless Tobacco Never Used     Counseling given: Not Answered   Clinical Intake:  Pre-visit preparation completed: Yes  Pain : No/denies pain     Nutritional Status: BMI > 30  Obese Nutritional Risks: None Diabetes: No  How often do you need to have someone help you when you read instructions, pamphlets, or other written materials from your doctor or pharmacy?: 1 - Never  Interpreter Needed?: No  Information entered by :: KClemetine MarkerLPN  Past Medical History:  Diagnosis Date  . Angular cheilitis   . Anxiety   . Cardiac murmur 03/05/2017  . Carotid atherosclerosis   . Carotid bruit   . CKD (chronic kidney disease) stage 2, GFR 60-89 ml/min   . Depression   . Elevated blood pressure   . Frequency   . Grade I diastolic dysfunction 19/81/1914  Echo Jan 2019  . Gross hematuria   . Hyperlipidemia   . Hypertension   . Nocturia   . Pyuria   . Sciatica   . Sigmoid  diverticulosis Oct. 2015   CT scan  . Vitamin B12 deficiency   . Vitamin D deficiency disease    Past Surgical History:  Procedure Laterality Date  . ABDOMINAL HYSTERECTOMY  1987  . APPENDECTOMY  1960  . CARPAL TUNNEL RELEASE Left 1988  . EXCISION NEUROMA Left 1988  . TONSILLECTOMY  1962   Family History  Problem Relation Age of Onset  . Hypertension Mother   . Heart disease Mother        CHF for months, aortic stenosis for years  . Clotting disorder Mother   . Aortic stenosis Mother   . Heart failure Mother   . Depression Mother   . Deep vein thrombosis Mother   . Other Sister        muscle disease, polymyositis  . Heart failure Sister   . Pneumonia Sister        cause of death  . ALS Father   . Urolithiasis Father   . Thyroid disease Father   . CVA Father   . Thyroid disease Sister   . Clotting disorder Sister   . Hyperlipidemia Sister   . Migraines Sister   . Deep vein thrombosis Sister   . Cancer Brother 50       lung, brain cancer  . Diabetes Maternal Grandmother   . Stroke Brother   . Heart murmur Brother  rheumatic fever   Social History   Socioeconomic History  . Marital status: Divorced    Spouse name: Not on file  . Number of children: 2  . Years of education: Not on file  . Highest education level: Some college, no degree  Occupational History  . Occupation: retired  Tobacco Use  . Smoking status: Never Smoker  . Smokeless tobacco: Never Used  Substance and Sexual Activity  . Alcohol use: Yes    Comment: occasional wine  . Drug use: No  . Sexual activity: Not Currently    Partners: Male  Other Topics Concern  . Not on file  Social History Narrative  . Not on file   Social Determinants of Health   Financial Resource Strain: Low Risk   . Difficulty of Paying Living Expenses: Not hard at all  Food Insecurity: No Food Insecurity  . Worried About Charity fundraiser in the Last Year: Never true  . Ran Out of Food in the Last Year:  Never true  Transportation Needs: No Transportation Needs  . Lack of Transportation (Medical): No  . Lack of Transportation (Non-Medical): No  Physical Activity: Inactive  . Days of Exercise per Week: 0 days  . Minutes of Exercise per Session: 0 min  Stress: No Stress Concern Present  . Feeling of Stress : Not at all  Social Connections: Moderately Isolated  . Frequency of Communication with Friends and Family: More than three times a week  . Frequency of Social Gatherings with Friends and Family: Three times a week  . Attends Religious Services: Never  . Active Member of Clubs or Organizations: No  . Attends Archivist Meetings: Never  . Marital Status: Divorced    Outpatient Encounter Medications as of 04/17/2019  Medication Sig  . aspirin EC 81 MG tablet Take 81 mg by mouth daily.  Marland Kitchen CALCIUM PO Take 500 mg by mouth daily.   . Cholecalciferol (VITAMIN D) 2000 UNITS CAPS Take 2,000 Units by mouth daily.  Marland Kitchen ezetimibe (ZETIA) 10 MG tablet Take 1 tablet (10 mg total) by mouth daily.  Marland Kitchen lisinopril (PRINIVIL,ZESTRIL) 10 MG tablet Take 1 tablet (10 mg total) by mouth daily. For blood pressure  . rosuvastatin (CRESTOR) 40 MG tablet Take 1 tablet (40 mg total) by mouth daily.  . vitamin B-12 (CYANOCOBALAMIN) 1000 MCG tablet Take 1 tablet by mouth daily.   No facility-administered encounter medications on file as of 04/17/2019.    Activities of Daily Living In your present state of health, do you have any difficulty performing the following activities: 04/17/2019 02/07/2019  Hearing? N N  Comment declines hearing aids -  Vision? N N  Comment - -  Difficulty concentrating or making decisions? N N  Comment - -  Walking or climbing stairs? N N  Dressing or bathing? N N  Doing errands, shopping? N N  Preparing Food and eating ? N -  Using the Toilet? N -  In the past six months, have you accidently leaked urine? N -  Do you have problems with loss of bowel control? N -    Managing your Medications? N -  Managing your Finances? N -  Housekeeping or managing your Housekeeping? N -  Some recent data might be hidden    Patient Care Team: Delsa Grana, PA-C as PCP - General (Family Medicine) Emmaline Kluver., MD as Consulting Physician (Rheumatology) Ubaldo Glassing Javier Docker, MD as Consulting Physician (Cardiology) Renato Shin, MD as Consulting Physician (  Endocrinology)    Assessment:   This is a routine wellness examination for Merian.  Exercise Activities and Dietary recommendations Current Exercise Habits: The patient does not participate in regular exercise at present, Exercise limited by: None identified  Goals    . DIET - EAT MORE FRUITS AND VEGETABLES    . Exercise 150 min/wk Moderate Activity    . Reduce fat intake     Limit saturated fats       Fall Risk Fall Risk  04/17/2019 02/07/2019 08/07/2018 07/23/2018 04/09/2018  Falls in the past year? 1 1 0 0 0  Number falls in past yr: 1 1 - 0 0  [place] during [place] - - - -  Injury with Fall? 1 1 - 0 0  Comment right foot injury; 2 fractured toes - no surgery - - - -  Risk for fall due to : No Fall Risks - - - -  Follow up Falls prevention discussed - - - Falls prevention discussed   FALL RISK PREVENTION PERTAINING TO THE HOME:  Any stairs in or around the home? Yes  If so, do they handrails? Yes   Home free of loose throw rugs in walkways, pet beds, electrical cords, etc? Yes  Adequate lighting in your home to reduce risk of falls? Yes   ASSISTIVE DEVICES UTILIZED TO PREVENT FALLS:  Life alert? No  Use of a cane, walker or w/c? No  Grab bars in the bathroom? No  Shower chair or bench in shower? No  Elevated toilet seat or a handicapped toilet? No   DME ORDERS:  DME order needed?  No   TIMED UP AND GO:  Was the test performed? Yes .  Length of time to ambulate 10 feet: 5 sec.   GAIT:  Appearance of gait: Gait stead-fast and without the use of an assistive  device.   Education: Fall risk prevention has been discussed.  Intervention(s) required? No    Depression Screen PHQ 2/9 Scores 04/17/2019 02/07/2019 08/07/2018 07/23/2018  PHQ - 2 Score 0 0 0 0  PHQ- 9 Score - 0 0 0     Cognitive Function     6CIT Screen 04/17/2019 04/09/2018 04/05/2017  What Year? 0 points 0 points 0 points  What month? 0 points 0 points 0 points  What time? 0 points 0 points 0 points  Count back from 20 0 points 0 points 0 points  Months in reverse 0 points 0 points 0 points  Repeat phrase 0 points 0 points 2 points  Total Score 0 0 2    Immunization History  Administered Date(s) Administered  . Fluad Quad(high Dose 65+) 02/07/2019  . Influenza, High Dose Seasonal PF 11/04/2015, 11/02/2016, 02/05/2018  . Influenza,inj,Quad PF,6+ Mos 12/03/2014  . Pneumococcal Conjugate-13 12/03/2014  . Pneumococcal Polysaccharide-23 02/02/2017  . Zoster 05/22/2013    Qualifies for Shingles Vaccine? Yes  Zostavax completed 2015. Due for Shingrix. Education has been provided regarding the importance of this vaccine. Pt has been advised to call insurance company to determine out of pocket expense. Advised may also receive vaccine at local pharmacy or Health Dept. Verbalized acceptance and understanding.  Tdap: Although this vaccine is not a covered service during a Wellness Exam, does the patient still wish to receive this vaccine today?  No .  Education has been provided regarding the importance of this vaccine. Advised may receive this vaccine at local pharmacy or Health Dept. Aware to provide a copy of  the vaccination record if obtained from local pharmacy or Health Dept. Verbalized acceptance and understanding.  Flu Vaccine: Up to date  Pneumococcal Vaccine: Up to date   Screening Tests Health Maintenance  Topic Date Due  . MAMMOGRAM  09/03/1966  . TETANUS/TDAP  09/03/1967  . Fecal DNA (Cologuard)  09/03/1998  . DEXA SCAN  09/02/2013  . INFLUENZA VACCINE  Completed  .  Hepatitis C Screening  Completed  . PNA vac Low Risk Adult  Completed    Cancer Screenings:  Colorectal Screening: DUE - pt states she has Cologuard test kit at home that is still in date and plans to completed.   Mammogram: Due -  Ordered today. Pt provided with contact information and advised to call to schedule appt.   Bone Density: DUE -  Ordered today. Pt provided with contact information and advised to call to schedule appt.   Lung Cancer Screening: (Low Dose CT Chest recommended if Age 110-80 years, 30 pack-year currently smoking OR have quit w/in 15years.) does not qualify.   Additional Screening:  Hepatitis C Screening: does qualify; Completed 07/30/15  Vision Screening: Recommended annual ophthalmology exams for early detection of glaucoma and other disorders of the eye. Is the patient up to date with their annual eye exam?  Yes  Who is the provider or what is the name of the office in which the pt attends annual eye exams? [facility]  Dental Screening: Recommended annual dental exams for proper oral hygiene  Community Resource Referral:  CRR required this visit?  No      Plan:     I have personally reviewed and addressed the Medicare Annual Wellness questionnaire and have noted the following in the patient's chart:  A. Medical and social history B. Use of alcohol, tobacco or illicit drugs  C. Current medications and supplements D. Functional ability and status E.  Nutritional status F.  Physical activity G. Advance directives H. List of other physicians I.  Hospitalizations, surgeries, and ER visits in previous 12 months J.  [place] such as hearing and vision if needed, cognitive and depression L. Referrals and appointments   In addition, I have reviewed and discussed with patient certain preventive protocols, quality metrics, and best practice recommendations. A written personalized care plan for preventive services as well as general preventive health  recommendations were provided to patient.   Signed,  Clemetine Marker, LPN Nurse Health Advisor   Nurse Notes: pt has appt 04/18/19 for her first Covid vaccine at [facility]

## 2019-05-11 ENCOUNTER — Other Ambulatory Visit: Payer: Self-pay | Admitting: Family Medicine

## 2019-05-11 NOTE — Telephone Encounter (Signed)
Requested medication (s) are due for refill today: no  Requested medication (s) are on the active medication list: No  Last refill:  Medication discontinued on 02/07/19  Future visit scheduled: yes  Notes to clinic: Medication not on current med list    Requested Prescriptions  Pending Prescriptions Disp Refills   venlafaxine XR (EFFEXOR-XR) 37.5 MG 24 hr capsule [Pharmacy Med Name: VENLAFAXINE HCL ER 37.5 MG CAP] 90 capsule 3    Sig: TAKE 1 CAPSULE (37.5 MG TOTAL) BY MOUTH DAILY WITH BREAKFAST.      Psychiatry: Antidepressants - SNRI - desvenlafaxine & venlafaxine Failed - 05/11/2019 12:53 AM      Failed - LDL in normal range and within 360 days    LDL Cholesterol (Calc)  Date Value Ref Range Status  02/07/2019 155 (H) mg/dL (calc) Final    Comment:    Reference range: <100 . Desirable range <100 mg/dL for primary prevention;   <70 mg/dL for patients with CHD or diabetic patients  with > or = 2 CHD risk factors. Marland Kitchen LDL-C is now calculated using the Martin-Hopkins  calculation, which is a validated novel method providing  better accuracy than the Friedewald equation in the  estimation of LDL-C.  Cresenciano Genre et al. Annamaria Helling. MU:[id number]): 2061-2068  (http://education.[facility].com/faq/FAQ164)           Failed - Total Cholesterol in normal range and within 360 days    Cholesterol, Total  Date Value Ref Range Status  08/09/2015 175 100 - 199 mg/dL Final   Cholesterol  Date Value Ref Range Status  02/07/2019 236 (H) <200 mg/dL Final          Passed - Triglycerides in normal range and within 360 days    Triglycerides  Date Value Ref Range Status  02/07/2019 124 <150 mg/dL Final          Passed - Completed PHQ-2 or PHQ-9 in the last 360 days.      Passed - Last BP in normal range    BP Readings from Last 1 Encounters:  04/17/19 138/84          Passed - Valid encounter within last 6 months    Recent Outpatient Visits           3 months ago Essential  hypertension, benign   [facility] Delsa Grana, PA-C   9 months ago Essential hypertension, benign   [place], [place], NP   9 months ago [place], NP   1 year ago Carpal tunnel syndrome on right   [place], MD   1 year ago Carotid atherosclerosis, bilateral   [place], Satira Anis, MD       Future Appointments             In 2 months Delsa Grana, PA-C [facility], [place]   In 11 months  [facility]

## 2019-05-23 ENCOUNTER — Telehealth: Payer: Medicare HMO | Admitting: Physician Assistant

## 2019-05-23 DIAGNOSIS — B009 Herpesviral infection, unspecified: Secondary | ICD-10-CM

## 2019-05-23 MED ORDER — VALACYCLOVIR HCL 1 G PO TABS: 2000.0000 mg | ORAL_TABLET | Freq: Two times a day (BID) | ORAL | 0 refills | Status: AC

## 2019-05-23 NOTE — Progress Notes (Signed)
We are sorry that you are not feeling well.  Here is how we plan to help!  Based on what you have shared with me it does look like you have a viral infection.    Most cold sores or fever blisters are small fluid filled blisters around the mouth caused by herpes simplex virus.  The most common strain of the virus causing cold sores is herpes simplex virus 1.  It can be spread by skin contact, sharing eating utensils, or even sharing towels.  Cold sores are contagious to other people until dry. (Approximately 5-7 days).  Wash your hands. You can spread the virus to your eyes through handling your contact lenses after touching the lesions.  Most people experience pain at the sight or tingling sensations in their lips that may begin before the ulcers erupt.  Herpes simplex is treatable but not curable.  It may lie dormant for a long time and then reappear due to stress or prolonged sun exposure.  Many patients have success in treating their cold sores with an over the counter topical called Abreva.  You may apply the cream up to 5 times daily (maximum 10 days) until healing occurs.  If you would like to use an oral antiviral medication to speed the healing of your cold sore, I have sent a prescription to your local pharmacy Valacyclovir 2 gm take one by mouth twice a day for 1 day    HOME CARE:   Wash your hands frequently.  Do not pick at or rub the sore.  Don't open the blisters.  Avoid kissing other people during this time.  Avoid sharing drinking glasses, eating utensils, or razors.  Do not handle contact lenses unless you have thoroughly washed your hands with soap and warm water!  Avoid oral sex during this time.  Herpes from sores on your mouth can spread to your partner's genital area.  Avoid contact with anyone who has eczema or a weakened immune system.  Cold sores are often triggered by exposure to intense sunlight, use a lip balm containing a sunscreen (SPF 30 or  higher).  GET HELP RIGHT AWAY IF:   Blisters look infected.  Blisters occur near or in the eye.  Symptoms last longer than 10 days.  Your symptoms become worse.  MAKE SURE YOU:   Understand these instructions.  Will watch your condition.  Will get help right away if you are not doing well or get worse.    Your e-visit answers were reviewed by a board certified advanced clinical practitioner to complete your personal care plan.  Depending upon the condition, your plan could have  Included both over the counter or prescription medications.    Please review your pharmacy choice.  Be sure that the pharmacy you have chosen is open so that you can pick up your prescription now.  If there is a problem you can message your provider in MyChart to have the prescription routed to another pharmacy.    Your safety is important to us.  If you have drug allergies check our prescription carefully.  For the next 24 hours you can use MyChart to ask questions about today's visit, request a non-urgent call back, or ask for a work or school excuse from your e-visit provider.  You will get an email in the next two days asking about your experience.  I hope that your e-visit has been valuable and will speed your recovery.   Greater than 5 minutes, yet less   than 10 minutes of time have been spent researching, coordinating and implementing care for this patient today.    

## 2019-05-24 DIAGNOSIS — J340 Abscess, furuncle and carbuncle of nose: Secondary | ICD-10-CM | POA: Diagnosis not present

## 2019-08-08 ENCOUNTER — Ambulatory Visit (INDEPENDENT_AMBULATORY_CARE_PROVIDER_SITE_OTHER): Payer: Medicare HMO | Admitting: Family Medicine

## 2019-08-08 ENCOUNTER — Other Ambulatory Visit: Payer: Self-pay

## 2019-08-08 ENCOUNTER — Encounter: Payer: Self-pay | Admitting: Family Medicine

## 2019-08-08 VITALS — BP 126/78 | HR 85 | Temp 97.9°F | Resp 14 | Ht 59.0 in | Wt 154.7 lb

## 2019-08-08 DIAGNOSIS — N182 Chronic kidney disease, stage 2 (mild): Secondary | ICD-10-CM

## 2019-08-08 DIAGNOSIS — R635 Abnormal weight gain: Secondary | ICD-10-CM | POA: Diagnosis not present

## 2019-08-08 DIAGNOSIS — E559 Vitamin D deficiency, unspecified: Secondary | ICD-10-CM

## 2019-08-08 DIAGNOSIS — F331 Major depressive disorder, recurrent, moderate: Secondary | ICD-10-CM | POA: Diagnosis not present

## 2019-08-08 DIAGNOSIS — R55 Syncope and collapse: Secondary | ICD-10-CM | POA: Diagnosis not present

## 2019-08-08 DIAGNOSIS — Z5181 Encounter for therapeutic drug level monitoring: Secondary | ICD-10-CM | POA: Diagnosis not present

## 2019-08-08 DIAGNOSIS — I10 Essential (primary) hypertension: Secondary | ICD-10-CM | POA: Diagnosis not present

## 2019-08-08 DIAGNOSIS — E782 Mixed hyperlipidemia: Secondary | ICD-10-CM

## 2019-08-08 NOTE — Progress Notes (Signed)
Name: Sarah Gillespie   MRN: [id number]    DOB: November 30, 1950   Date:08/08/2019       Progress Note  Chief Complaint  Patient presents with  . Follow-up  . Hypertension  . Hyperlipidemia     Subjective:   Sarah Gillespie is a 69 y.o. female, presents to clinic for routine follow up on the conditions listed above.  Here for f/up on routine conditions - last OV pt endorsed overal poor med compliance HTN was well controlled, but renal function declined slightly HLD was poorly controlled - very forgetful with meds at bedtime Stopped/very poor compliance with supplements Vit D was very low and CBC screening was normal - did not specifically check B12  HTN - lisinopril 10 mg daily Pt reports good med compliance and denies any SE.   Blood pressure today is well controlled. BP Readings from Last 3 Encounters:  08/08/19 126/78  04/17/19 138/84  02/07/19 118/82   Pt denies CP, SOB, exertional sx, LE edema, palpitation, Ha's, visual disturbances, No lightheadedness, hypotension, syncope.  Hyperlipidemia: Current Medication Regimen:  zetia 10 mg and crestor 40 mg, improving med compliance-she states she is remembering most days of the week now Last Lipids: Lab Results  Component Value Date   CHOL 236 (H) 02/07/2019   HDL 56 02/07/2019   LDLCALC 155 (H) 02/07/2019   TRIG 124 02/07/2019   CHOLHDL 4.2 02/07/2019   - Denies: Chest pain, shortness of breath, myalgias.  Vit D deficiency: Last vitamin D Lab Results  Component Value Date   VD25OH 17 (L) 02/07/2019   Pt is DNR status - reviewed HM tab, she has not done a lot of the screenings completed previously dexa and mammogram have been ordered for her, CRC screening not done, tdap and COVID due Health Maintenance  Topic Date Due  . MAMMOGRAM  Never done  . Fecal DNA (Cologuard)  Never done  . DEXA SCAN  Never done  . TETANUS/TDAP  08/07/2020 (Originally 09/03/1967)  . INFLUENZA VACCINE  10/05/2019  . COVID-19 Vaccine   Completed  . Hepatitis C Screening  Completed  . PNA vac Low Risk Adult  Completed     Wt Readings from Last 10 Encounters:  08/08/19 154 lb 11.2 oz (70.2 kg)  04/17/19 155 lb 9.6 oz (70.6 kg)  02/07/19 159 lb 3.2 oz (72.2 kg)  08/07/18 157 lb 14.4 oz (71.6 kg)  07/23/18 158 lb 1.6 oz (71.7 kg)  04/09/18 152 lb 12.8 oz (69.3 kg)  02/05/18 155 lb 4.8 oz (70.4 kg)  08/03/17 146 lb (66.2 kg)  05/23/17 151 lb 1.6 oz (68.5 kg)  04/16/17 150 lb 9.6 oz (68.3 kg)   BMI Readings from Last 5 Encounters:  08/08/19 31.25 kg/m  04/17/19 31.43 kg/m  02/07/19 32.15 kg/m  08/07/18 31.89 kg/m  07/23/18 31.93 kg/m   She got her COVID shots -     Patient Active Problem List   Diagnosis Date Noted  . Class 1 obesity with serious comorbidity and body mass index (BMI) of 32.0 to 32.9 in adult 02/07/2019  . DNR no code (do not resuscitate) 04/05/2017  . Mild concentric left ventricular hypertrophy (LVH) 03/20/2017  . CKD (chronic kidney disease) stage 2, GFR 60-89 ml/min 11/02/2016  . Knee sprain and strain 11/08/2015  . Sciatica 10/08/2015  . Postmenopausal 12/16/2014  . Trochanteric bursitis of both hips 12/07/2014  . Hyperlipidemia   . Vitamin B12 deficiency   . Vitamin D deficiency disease   . Carotid  atherosclerosis   . Essential hypertension, benign   . Major depressive disorder, recurrent episode, moderate ([place]) 12/10/2013  . Sigmoid diverticulosis 12/04/2013    Past Surgical History:  Procedure Laterality Date  . ABDOMINAL HYSTERECTOMY  1987  . APPENDECTOMY  1960  . CARPAL TUNNEL RELEASE Left 1988  . EXCISION NEUROMA Left 1988  . TONSILLECTOMY  1962    Family History  Problem Relation Age of Onset  . Hypertension Mother   . Heart disease Mother        CHF for months, aortic stenosis for years  . Clotting disorder Mother   . Aortic stenosis Mother   . Heart failure Mother   . Depression Mother   . Deep vein thrombosis Mother   . Other Sister        muscle  disease, polymyositis  . Heart failure Sister   . Pneumonia Sister        cause of death  . ALS Father   . Urolithiasis Father   . Thyroid disease Father   . CVA Father   . Thyroid disease Sister   . Clotting disorder Sister   . Hyperlipidemia Sister   . Migraines Sister   . Deep vein thrombosis Sister   . Cancer Brother 50       lung, brain cancer  . Diabetes Maternal Grandmother   . Stroke Brother   . Heart murmur Brother        rheumatic fever    Social History   Tobacco Use  . Smoking status: Never Smoker  . Smokeless tobacco: Never Used  Substance Use Topics  . Alcohol use: Yes    Comment: occasional wine  . Drug use: No      Current Outpatient Medications:  .  aspirin EC 81 MG tablet, Take 81 mg by mouth daily., Disp: , Rfl:  .  CALCIUM PO, Take 500 mg by mouth daily. , Disp: , Rfl:  .  Cholecalciferol (VITAMIN D) 2000 UNITS CAPS, Take 2,000 Units by mouth daily., Disp: , Rfl:  .  ezetimibe (ZETIA) 10 MG tablet, Take 1 tablet (10 mg total) by mouth daily., Disp: 90 tablet, Rfl: 3 .  lisinopril (PRINIVIL,ZESTRIL) 10 MG tablet, Take 1 tablet (10 mg total) by mouth daily. For blood pressure, Disp: 90 tablet, Rfl: 3 .  rosuvastatin (CRESTOR) 40 MG tablet, Take 1 tablet (40 mg total) by mouth daily., Disp: 90 tablet, Rfl: 3 .  vitamin B-12 (CYANOCOBALAMIN) 1000 MCG tablet, Take 1 tablet by mouth daily., Disp: , Rfl:   No Known Allergies  Chart Review Today: I personally reviewed active problem list, medication list, allergies, family history, social history, health maintenance, notes from last encounter, lab results, imaging with the patient/caregiver today.   Review of Systems  10 Systems reviewed and are negative for acute change except as noted in the HPI.  Objective:    Vitals:   08/08/19 1314  BP: 126/78  Pulse: 85  Resp: 14  Temp: 97.9 F (36.6 C)  SpO2: 94%  Weight: 154 lb 11.2 oz (70.2 kg)  Height: 4\' 11"  (1.499 m)    Body mass index is 31.25  kg/m.  Physical Exam Vitals and nursing note reviewed.  Constitutional:      General: She is not in acute distress.    Appearance: Normal appearance. She is well-developed. She is not ill-appearing, toxic-appearing or diaphoretic.     Interventions: Face mask in place.  HENT:     Head: Normocephalic and atraumatic.  Right Ear: External ear normal.     Left Ear: External ear normal.  Eyes:     General: Lids are normal. No scleral icterus.       Right eye: No discharge.        Left eye: No discharge.     Conjunctiva/sclera: Conjunctivae normal.  Neck:     Trachea: Phonation normal. No tracheal deviation.  Cardiovascular:     Rate and Rhythm: Normal rate and regular rhythm.     Pulses: Normal pulses.          Radial pulses are 2+ on the right side and 2+ on the left side.       Posterior tibial pulses are 2+ on the right side and 2+ on the left side.     Heart sounds: Normal heart sounds. No murmur. No friction rub. No gallop.   Pulmonary:     Effort: Pulmonary effort is normal. No respiratory distress.     Breath sounds: Normal breath sounds. No stridor. No wheezing, rhonchi or rales.  Chest:     Chest wall: No tenderness.  Abdominal:     General: Bowel sounds are normal. There is no distension.     Palpations: Abdomen is soft.     Tenderness: There is no abdominal tenderness. There is no guarding or rebound.  Musculoskeletal:        General: No deformity. Normal range of motion.     Cervical back: Normal range of motion and neck supple.     Right lower leg: No edema.     Left lower leg: No edema.  Lymphadenopathy:     Cervical: No cervical adenopathy.  Skin:    General: Skin is warm and dry.     Capillary Refill: Capillary refill takes less than 2 seconds.     Coloration: Skin is not jaundiced or pale.     Findings: No rash.  Neurological:     Mental Status: She is alert and oriented to person, place, and time.     Motor: No abnormal muscle tone.     Gait: Gait  normal.  Psychiatric:        Speech: Speech normal.        Behavior: Behavior normal.      PHQ2/9: Depression screen [facility] 2/9 08/08/2019 04/17/2019 02/07/2019 08/07/2018 07/23/2018  Decreased Interest 0 0 0 0 0  Down, Depressed, Hopeless 0 0 0 0 0  PHQ - 2 Score 0 0 0 0 0  Altered sleeping 0 - 0 0 0  Tired, decreased energy 0 - 0 0 0  Change in appetite 0 - 0 0 0  Feeling bad or failure about yourself  0 - 0 0 0  Trouble concentrating 0 - 0 0 0  Moving slowly or fidgety/restless 0 - 0 0 0  Suicidal thoughts 0 - 0 0 0  PHQ-9 Score 0 - 0 0 0  Difficult doing work/chores Not difficult at all - Not difficult at all Not difficult at all Not difficult at all  Some recent data might be hidden    phq 9 is neg, reviewed today  Fall Risk: Fall Risk  08/08/2019 04/17/2019 02/07/2019 08/07/2018 07/23/2018  Falls in the past year? 0 1 1 0 0  Number falls in past yr: 0 1 1 - 0  Comment [place] - - -  Injury with Fall? 0 1 1 - 0  Comment - right foot injury; 2 fractured toes -  no surgery - - -  Risk for fall due to : - No Fall Risks - - -  Follow up - Falls prevention discussed - - -    Functional Status Survey: Is the patient deaf or have difficulty hearing?: No Does the patient have difficulty seeing, even when wearing glasses/contacts?: No Does the patient have difficulty concentrating, remembering, or making decisions?: No Does the patient have difficulty walking or climbing stairs?: No Does the patient have difficulty dressing or bathing?: No Does the patient have difficulty doing errands alone such as visiting a doctor's office or shopping?: No   Assessment & Plan:   1. Essential hypertension, benign Stable, well-controlled today, continue lisinopril 10 mg daily - COMPLETE METABOLIC PANEL WITH GFR  2. CKD (chronic kidney disease) stage 2, GFR 60-89 ml/min Blood pressure is well controlled, she does not use NSAIDs frequently, will check a monitor renal  function - COMPLETE METABOLIC PANEL WITH GFR  3. Mixed hyperlipidemia She is taking her statin more often now she reports about 5 to 6 days out of every week, she has been tolerating this, we will recheck her lipid panel to see if this is improved - COMPLETE METABOLIC PANEL WITH GFR - Lipid panel  4. Vitamin D deficiency disease Previously very low vitamin D without over-the-counter supplement, check today and supplement as needed - COMPLETE METABOLIC PANEL WITH GFR - VITAMIN D 25 Hydroxy (Vit-D Deficiency, Fractures)  5. Major depressive disorder, recurrent episode, moderate ([place]) PHQ 9 reviewed today was negative, feels mood is good without any current medications or need for meds - COMPLETE METABOLIC PANEL WITH GFR - Lipid panel - VITAMIN D 25 Hydroxy (Vit-D Deficiency, Fractures)  7. Weight gain Unintentional weight gain check thyroid and rule out hypothyroid - TSH - T4, free  30-month routine follow-up visit  Delsa Grana, PA-C 08/08/19 1:31 PM

## 2019-08-08 NOTE — Patient Instructions (Signed)
[email or web address] Look at this website from the american heart association for recommended cuffs - can ask around to borrow one too or look over the counter  How to Take a Pulse Your pulse is the increase in pressure inside the blood vessels that carry blood from your heart to the rest of your body (arteries). Every time your heart beats, you can feel your pulse in an artery near the surface of your skin. You can easily feel your pulse in the artery in your wrist (radial artery) and in the artery in your neck (carotid artery). Taking your pulse can tell you how fast your heart is beating and whether it has a normal rhythm. You can also tell whether your heart is beating strongly or weakly. What you need to know about pulse rates Your pulse is the same as your heart rate. Both are measured in beats per minute (bpm). A normal resting heart rate varies depending on a person's age.  Infants under 1 year of age: Normal heart rate of 100-160 bpm.  Children 59-55 years of age: Normal heart rate of 90-150 bpm.  Children 25-62 years of age: Normal heart rate of 80-140 bpm.  Children 6-67 years of age: Normal heart rate of 70-120 bpm.  Everyone over 56 years of age: Normal heart rate of 60-100 bpm. There can be a lot of variation in your pulse. It can be different depending on the time of day or the amount of exercise that you get. It changes with your fitness level. Many things can change the speed and regularity of your pulse. These include:  Exercise.  Fever.  Stress.  Heart problems.  Poor circulation.  Medicines. How to take your pulse To take your pulse, all you need is a digital stopwatch or a clock or watch that has a second hand. The best time to measure your resting pulse is in the morning before you start moving around. Take it as soon as you wake up or after resting for about 10 minutes. There are no firm rules about how often to check your pulse. In general, it is a good idea  to check your pulse at least once a month. Measuring your pulse is a good way to check your heart health. Checking your pulse before and after exercise can tell you if you are getting the right amount of exercise. This is called finding your target heart rate. Your target heart rate depends on your age, fitness, and health. Ask your health care provider what would be a safe target heart rate for you during exercise. Radial Pulse To check the pulse in your radial artery: 1. Turn one hand palm-up and relax your arm. 2. Place the first two fingers of your other hand gently over your wrist, just below the base of your thumb. 3. Place your fingertips just inside the bone that runs along the outside of your arm. 4. Slowly increase pressure until you feel a pulsing beneath your fingers. You may need to move your fingers slightly. 5. Do not press too hard. Too much pressure may cut off blood supply. 6. Count how many pulse beats you feel in 1 minute. Or, count how many pulse beats you feel in 30 seconds and double that number. 7. Pay attention to the rhythm of the pulse. It should be steady and even.  Carotid Pulse To check the pulse in your carotid artery: 1. Place two fingers just to one side of your Adam's apple so that you feel  a pulsing beneath your fingers. 2. Do not press too hard. Too much pressure may cut off blood supply and can make you dizzy. 3. Count how many pulse beats you feel in 1 minute. Or, count how many pulse beats you feel in 30 seconds and double that number. 4. Pay attention to the rhythm of the pulse. It should be steady and even.  Contact a health care provider if:  Your pulse is too slow or too fast.  Your pulse is weak or hard to find.  You have skipped beats or extra beats.  Your pulse has an irregular rhythm.  You have an abnormal pulse along with dizziness, fatigue, or shortness of breath. This information is not intended to replace advice given to you by your  health care provider. Make sure you discuss any questions you have with your health care provider. Document Revised: 05/14/2018 Document Reviewed: 07/27/2015 Elsevier Patient Education  2020 [employer].

## 2019-08-09 LAB — COMPLETE METABOLIC PANEL WITH GFR
AG Ratio: 2 (calc) (ref 1.0–2.5)
ALT: 11 U/L (ref 6–29)
AST: 17 U/L (ref 10–35)
Albumin: 4.2 g/dL (ref 3.6–5.1)
Alkaline phosphatase (APISO): 86 U/L (ref 37–153)
BUN/Creatinine Ratio: 20 (calc) (ref 6–22)
BUN: 20 mg/dL (ref 7–25)
CO2: 23 mmol/L (ref 20–32)
Calcium: 9.6 mg/dL (ref 8.6–10.4)
Chloride: 105 mmol/L (ref 98–110)
Creat: 1.01 mg/dL — ABNORMAL HIGH (ref 0.60–0.93)
GFR, Est African American: 65 mL/min/{1.73_m2} (ref >=60)
GFR, Est Non African American: 56 mL/min/{1.73_m2} — ABNORMAL LOW (ref >=60)
Globulin: 2.1 g/dL (calc) (ref 1.9–3.7)
Glucose, Bld: 93 mg/dL (ref 65–99)
Potassium: 4.1 mmol/L (ref 3.5–5.3)
Sodium: 139 mmol/L (ref 135–146)
Total Bilirubin: 0.4 mg/dL (ref 0.2–1.2)
Total Protein: 6.3 g/dL (ref 6.1–8.1)

## 2019-08-09 LAB — LIPID PANEL
Cholesterol: 175 mg/dL (ref <200)
HDL: 57 mg/dL (ref >=50)
LDL Cholesterol (Calc): 100 mg/dL (calc) — ABNORMAL HIGH
Non-HDL Cholesterol (Calc): 118 mg/dL (calc) (ref <130)
Total CHOL/HDL Ratio: 3.1 (calc) (ref <5.0)
Triglycerides: 85 mg/dL (ref <150)

## 2019-08-09 LAB — T4, FREE: Free T4: 1.1 ng/dL (ref 0.8–1.8)

## 2019-08-09 LAB — VITAMIN D 25 HYDROXY (VIT D DEFICIENCY, FRACTURES): Vit D, 25-Hydroxy: 24 ng/mL — ABNORMAL LOW (ref 30–100)

## 2019-08-09 LAB — TSH: TSH: 2.35 mIU/L (ref 0.40–4.50)

## 2019-08-11 ENCOUNTER — Other Ambulatory Visit: Payer: Self-pay | Admitting: Family Medicine

## 2019-08-11 DIAGNOSIS — E782 Mixed hyperlipidemia: Secondary | ICD-10-CM

## 2019-08-11 DIAGNOSIS — I6521 Occlusion and stenosis of right carotid artery: Secondary | ICD-10-CM

## 2019-08-11 MED ORDER — ROSUVASTATIN CALCIUM 40 MG PO TABS: 40.0000 mg | ORAL_TABLET | Freq: Every day | ORAL | 3 refills | Status: AC

## 2019-08-11 MED ORDER — LISINOPRIL 10 MG PO TABS: 10.0000 mg | ORAL_TABLET | Freq: Every day | ORAL | 3 refills | Status: DC

## 2019-08-11 MED ORDER — VITAMIN D (ERGOCALCIFEROL) 1.25 MG (50000 UNIT) PO CAPS: 50000.0000 [IU] | ORAL_CAPSULE | ORAL | 0 refills | Status: AC

## 2019-08-11 MED ORDER — EZETIMIBE 10 MG PO TABS: 10.0000 mg | ORAL_TABLET | Freq: Every day | ORAL | 3 refills | Status: AC

## 2019-08-29 ENCOUNTER — Emergency Department: Payer: Medicare HMO

## 2019-08-29 ENCOUNTER — Inpatient Hospital Stay
Admission: EM | Admit: 2019-08-29 | Discharge: 2019-08-31 | DRG: 385 | Disposition: A | Discharge status: Home / Self Care | Payer: Medicare HMO | Attending: Internal Medicine | Admitting: Internal Medicine

## 2019-08-29 ENCOUNTER — Other Ambulatory Visit: Payer: Self-pay

## 2019-08-29 DIAGNOSIS — I959 Hypotension, unspecified: Secondary | ICD-10-CM | POA: Diagnosis present

## 2019-08-29 DIAGNOSIS — Z79899 Other long term (current) drug therapy: Secondary | ICD-10-CM

## 2019-08-29 DIAGNOSIS — I129 Hypertensive chronic kidney disease with stage 1 through stage 4 chronic kidney disease, or unspecified chronic kidney disease: Secondary | ICD-10-CM | POA: Diagnosis present

## 2019-08-29 DIAGNOSIS — K5730 Diverticulosis of large intestine without perforation or abscess without bleeding: Secondary | ICD-10-CM | POA: Diagnosis not present

## 2019-08-29 DIAGNOSIS — K558 Other vascular disorders of intestine: Secondary | ICD-10-CM | POA: Diagnosis present

## 2019-08-29 DIAGNOSIS — K529 Noninfective gastroenteritis and colitis, unspecified: Secondary | ICD-10-CM | POA: Diagnosis not present

## 2019-08-29 DIAGNOSIS — K760 Fatty (change of) liver, not elsewhere classified: Secondary | ICD-10-CM | POA: Diagnosis not present

## 2019-08-29 DIAGNOSIS — Z20822 Contact with and (suspected) exposure to covid-19: Secondary | ICD-10-CM | POA: Diagnosis not present

## 2019-08-29 DIAGNOSIS — F331 Major depressive disorder, recurrent, moderate: Secondary | ICD-10-CM | POA: Diagnosis present

## 2019-08-29 DIAGNOSIS — N182 Chronic kidney disease, stage 2 (mild): Secondary | ICD-10-CM | POA: Diagnosis present

## 2019-08-29 DIAGNOSIS — K5900 Constipation, unspecified: Secondary | ICD-10-CM | POA: Diagnosis present

## 2019-08-29 DIAGNOSIS — K625 Hemorrhage of anus and rectum: Secondary | ICD-10-CM | POA: Diagnosis not present

## 2019-08-29 DIAGNOSIS — Z90710 Acquired absence of both cervix and uterus: Secondary | ICD-10-CM | POA: Diagnosis not present

## 2019-08-29 DIAGNOSIS — I10 Essential (primary) hypertension: Secondary | ICD-10-CM | POA: Diagnosis present

## 2019-08-29 DIAGNOSIS — R1032 Left lower quadrant pain: Secondary | ICD-10-CM | POA: Diagnosis not present

## 2019-08-29 DIAGNOSIS — K922 Gastrointestinal hemorrhage, unspecified: Secondary | ICD-10-CM | POA: Diagnosis not present

## 2019-08-29 DIAGNOSIS — K5150 Left sided colitis without complications: Principal | ICD-10-CM | POA: Diagnosis present

## 2019-08-29 DIAGNOSIS — K921 Melena: Secondary | ICD-10-CM | POA: Diagnosis present

## 2019-08-29 DIAGNOSIS — Z7982 Long term (current) use of aspirin: Secondary | ICD-10-CM

## 2019-08-29 DIAGNOSIS — E785 Hyperlipidemia, unspecified: Secondary | ICD-10-CM | POA: Diagnosis present

## 2019-08-29 DIAGNOSIS — K559 Vascular disorder of intestine, unspecified: Secondary | ICD-10-CM | POA: Diagnosis not present

## 2019-08-29 DIAGNOSIS — K5731 Diverticulosis of large intestine without perforation or abscess with bleeding: Secondary | ICD-10-CM | POA: Diagnosis present

## 2019-08-29 DIAGNOSIS — Z03818 Encounter for observation for suspected exposure to other biological agents ruled out: Secondary | ICD-10-CM | POA: Diagnosis not present

## 2019-08-29 LAB — URINALYSIS, COMPLETE (UACMP) WITH MICROSCOPIC
Bacteria, UA: NONE SEEN
Bilirubin Urine: NEGATIVE
Glucose, UA: NEGATIVE mg/dL
Ketones, ur: NEGATIVE mg/dL
Leukocytes,Ua: NEGATIVE
Nitrite: NEGATIVE
Protein, ur: NEGATIVE mg/dL
RBC / HPF: >50 RBC/hpf — ABNORMAL HIGH (ref 0–5)
Specific Gravity, Urine: 1.024 (ref 1.005–1.030)
pH: 5 (ref 5.0–8.0)

## 2019-08-29 LAB — CBC
HCT: 44.3 % (ref 36.0–46.0)
Hemoglobin: 15 g/dL (ref 12.0–15.0)
MCH: 31 pg (ref 26.0–34.0)
MCHC: 33.9 g/dL (ref 30.0–36.0)
MCV: 91.5 fL (ref 80.0–100.0)
Platelets: 323 10*3/uL (ref 150–400)
RBC: 4.84 MIL/uL (ref 3.87–5.11)
RDW: 13.6 % (ref 11.5–15.5)
WBC: 12 10*3/uL — ABNORMAL HIGH (ref 4.0–10.5)
nRBC: 0 % (ref 0.0–0.2)

## 2019-08-29 LAB — COMPREHENSIVE METABOLIC PANEL
ALT: 22 U/L (ref 0–44)
AST: 31 U/L (ref 15–41)
Albumin: 4.3 g/dL (ref 3.5–5.0)
Alkaline Phosphatase: 88 U/L (ref 38–126)
Anion gap: 12 (ref 5–15)
BUN: 21 mg/dL (ref 8–23)
CO2: 21 mmol/L — ABNORMAL LOW (ref 22–32)
Calcium: 9.7 mg/dL (ref 8.9–10.3)
Chloride: 103 mmol/L (ref 98–111)
Creatinine, Ser: 1.01 mg/dL — ABNORMAL HIGH (ref 0.44–1.00)
GFR calc Af Amer: >60 mL/min (ref >60)
GFR calc non Af Amer: 56 mL/min — ABNORMAL LOW (ref >60)
Glucose, Bld: 191 mg/dL — ABNORMAL HIGH (ref 70–99)
Potassium: 4.2 mmol/L (ref 3.5–5.1)
Sodium: 136 mmol/L (ref 135–145)
Total Bilirubin: 0.6 mg/dL (ref 0.3–1.2)
Total Protein: 7.4 g/dL (ref 6.5–8.1)

## 2019-08-29 LAB — HEMOGLOBIN AND HEMATOCRIT, BLOOD
HCT: 42.4 % (ref 36.0–46.0)
HCT: 43.3 % (ref 36.0–46.0)
Hemoglobin: 14.3 g/dL (ref 12.0–15.0)
Hemoglobin: 14.4 g/dL (ref 12.0–15.0)

## 2019-08-29 LAB — HIV ANTIBODY (ROUTINE TESTING W REFLEX): HIV Screen 4th Generation wRfx: NONREACTIVE

## 2019-08-29 LAB — LACTIC ACID, PLASMA: Lactic Acid, Venous: 2.1 mmol/L (ref 0.5–1.9)

## 2019-08-29 LAB — SARS CORONAVIRUS 2 BY RT PCR (HOSPITAL ORDER, PERFORMED IN ~~LOC~~ HOSPITAL LAB): SARS Coronavirus 2: NEGATIVE

## 2019-08-29 MED ORDER — ROSUVASTATIN CALCIUM 10 MG PO TABS
40.0000 mg | ORAL_TABLET | Freq: Every day | ORAL | Status: DC
  Administered 2019-08-29 – 2019-08-31 (×3): 40 mg via ORAL
  Filled 2019-08-29: qty 4
  Filled 2019-08-29: qty 2
  Filled 2019-08-29 (×2): qty 4

## 2019-08-29 MED ORDER — SODIUM CHLORIDE 0.9 % IV BOLUS
1000.0000 mL | Freq: Once | INTRAVENOUS | Status: AC
  Administered 2019-08-29: 1000 mL via INTRAVENOUS

## 2019-08-29 MED ORDER — SODIUM CHLORIDE 0.9 % IV SOLN: INTRAVENOUS | Status: DC

## 2019-08-29 MED ORDER — EZETIMIBE 10 MG PO TABS
10.0000 mg | ORAL_TABLET | Freq: Every day | ORAL | Status: DC
  Administered 2019-08-30 – 2019-08-31 (×2): 10 mg via ORAL
  Filled 2019-08-29 (×4): qty 1

## 2019-08-29 MED ORDER — SODIUM CHLORIDE 0.9% FLUSH
3.0000 mL | Freq: Two times a day (BID) | INTRAVENOUS | Status: DC
  Administered 2019-08-29 – 2019-08-31 (×5): 3 mL via INTRAVENOUS

## 2019-08-29 MED ORDER — ONDANSETRON HCL 4 MG/2ML IJ SOLN: 4.0000 mg | Freq: Four times a day (QID) | INTRAMUSCULAR | Status: DC | PRN

## 2019-08-29 MED ORDER — MORPHINE SULFATE (PF) 2 MG/ML IV SOLN
2.0000 mg | INTRAVENOUS | Status: DC | PRN
  Administered 2019-08-29 (×2): 2 mg via INTRAVENOUS
  Filled 2019-08-29 (×2): qty 1

## 2019-08-29 MED ORDER — HYDRALAZINE HCL 20 MG/ML IJ SOLN
10.0000 mg | Freq: Four times a day (QID) | INTRAMUSCULAR | Status: DC | PRN
  Filled 2019-08-29: qty 0.5

## 2019-08-29 MED ORDER — ONDANSETRON HCL 4 MG PO TABS: 4.0000 mg | ORAL_TABLET | Freq: Four times a day (QID) | ORAL | Status: DC | PRN

## 2019-08-29 MED ORDER — MORPHINE SULFATE (PF) 4 MG/ML IV SOLN
4.0000 mg | Freq: Once | INTRAVENOUS | Status: AC
  Administered 2019-08-29: 4 mg via INTRAVENOUS
  Filled 2019-08-29: qty 1

## 2019-08-29 MED ORDER — ONDANSETRON HCL 4 MG/2ML IJ SOLN
4.0000 mg | Freq: Once | INTRAMUSCULAR | Status: AC
  Administered 2019-08-29: 4 mg via INTRAVENOUS
  Filled 2019-08-29: qty 2

## 2019-08-29 MED ORDER — PANTOPRAZOLE SODIUM 40 MG IV SOLR
40.0000 mg | INTRAVENOUS | Status: DC
  Administered 2019-08-29 – 2019-08-30 (×2): 40 mg via INTRAVENOUS
  Filled 2019-08-29 (×2): qty 40

## 2019-08-29 MED ORDER — IOHEXOL 300 MG/ML  SOLN
100.0000 mL | Freq: Once | INTRAMUSCULAR | Status: AC | PRN
  Administered 2019-08-29: 100 mL via INTRAVENOUS

## 2019-08-29 NOTE — ED Notes (Signed)
Pt back from CT scan.  Placed back on monitor.

## 2019-08-29 NOTE — ED Provider Notes (Signed)
[facility] Emergency Department Provider Note  ____________________________________________   First MD Initiated Contact with Patient 08/29/19 [phone]     (approximate)  I have reviewed the triage vital signs and the nursing notes.   HISTORY  Chief Complaint Rectal Bleeding    HPI Sarah Gillespie is a 69 y.o. female with below list of previous medical conditions presents emergency department secondary to acute onset of rectal bleeding which patient states began last night.  Patient also admits to lower abdominal discomfort with current pain score of 8 out of 10.  Patient denies any aggravating or alleviating factors.  Patient states that she has had multiple bowel movements where she noted blood with what appears to be clots.  Patient denies any fever afebrile on presentation temperature 97.9.  Patient denies any nausea or vomiting.  Of note patient has never had a colonoscopy performed.        Past Medical History:  Diagnosis Date  . Angular cheilitis   . Anxiety   . Cardiac murmur 03/05/2017  . Carotid atherosclerosis   . Carotid bruit   . CKD (chronic kidney disease) stage 2, GFR 60-89 ml/min   . Depression   . Elevated blood pressure   . Frequency   . Grade I diastolic dysfunction 2/70/6237   Echo Jan 2019  . Gross hematuria   . Hyperlipidemia   . Hypertension   . Nocturia   . Pyuria   . Sciatica   . Sigmoid diverticulosis Oct. 2015   CT scan  . Vitamin B12 deficiency   . Vitamin D deficiency disease     Patient Active Problem List   Diagnosis Date Noted  . Class 1 obesity with serious comorbidity and body mass index (BMI) of 32.0 to 32.9 in adult 02/07/2019  . DNR no code (do not resuscitate) 04/05/2017  . Mild concentric left ventricular hypertrophy (LVH) 03/20/2017  . CKD (chronic kidney disease) stage 2, GFR 60-89 ml/min 11/02/2016  . Knee sprain and strain 11/08/2015  . Sciatica 10/08/2015  . Postmenopausal 12/16/2014  .  Trochanteric bursitis of both hips 12/07/2014  . Hyperlipidemia   . Vitamin B12 deficiency   . Vitamin D deficiency disease   . Carotid atherosclerosis   . Essential hypertension, benign   . Major depressive disorder, recurrent episode, moderate ([place]) 12/10/2013  . Sigmoid diverticulosis 12/04/2013    Past Surgical History:  Procedure Laterality Date  . ABDOMINAL HYSTERECTOMY  1987  . APPENDECTOMY  1960  . CARPAL TUNNEL RELEASE Left 1988  . EXCISION NEUROMA Left 1988  . TONSILLECTOMY  1962    Prior to Admission medications   Medication Sig Start Date End Date Taking? Authorizing Provider  aspirin EC 81 MG tablet Take 81 mg by mouth daily.    [provider]  CALCIUM PO Take 500 mg by mouth daily.     [provider]  Cholecalciferol (VITAMIN D) 2000 UNITS CAPS Take 2,000 Units by mouth daily.    [provider]  ezetimibe (ZETIA) 10 MG tablet Take 1 tablet (10 mg total) by mouth daily. 08/11/19   Delsa Grana, PA-C  lisinopril (ZESTRIL) 10 MG tablet Take 1 tablet (10 mg total) by mouth daily. For blood pressure 08/11/19   Delsa Grana, PA-C  rosuvastatin (CRESTOR) 40 MG tablet Take 1 tablet (40 mg total) by mouth daily. 08/11/19   Delsa Grana, PA-C  vitamin B-12 (CYANOCOBALAMIN) 1000 MCG tablet Take 1 tablet by mouth daily. 08/10/16   [provider]  Vitamin D, Ergocalciferol, (DRISDOL) 1.25 MG (50000 UNIT) CAPS capsule Take 1 capsule (50,000 Units total) by mouth every 7 (seven) days. x12 weeks. 08/11/19   Delsa Grana, PA-C    Allergies Patient has no known allergies.  Family History  Problem Relation Age of Onset  . Hypertension Mother   . Heart disease Mother        CHF for months, aortic stenosis for years  . Clotting disorder Mother   . Aortic stenosis Mother   . Heart failure Mother   . Depression Mother   . Deep vein thrombosis Mother   . Other Sister        muscle disease, polymyositis  . Heart failure Sister   . Pneumonia Sister         cause of death  . ALS Father   . Urolithiasis Father   . Thyroid disease Father   . CVA Father   . Thyroid disease Sister   . Clotting disorder Sister   . Hyperlipidemia Sister   . Migraines Sister   . Deep vein thrombosis Sister   . Cancer Brother 50       lung, brain cancer  . Diabetes Maternal Grandmother   . Stroke Brother   . Heart murmur Brother        rheumatic fever    Social History Social History   Tobacco Use  . Smoking status: Never Smoker  . Smokeless tobacco: Never Used  Vaping Use  . Vaping Use: Never used  Substance Use Topics  . Alcohol use: Yes    Comment: occasional wine  . Drug use: No    Review of Systems Constitutional: No fever/chills Eyes: No visual changes. ENT: No sore throat. Cardiovascular: Denies chest pain. Respiratory: Denies shortness of breath. Gastrointestinal: Positive for abdominal pain and bright red blood per rectum, diarrhea Genitourinary: Negative for dysuria. Musculoskeletal: Negative for neck pain.  Negative for back pain. Integumentary: Negative for rash. Neurological: Negative for headaches, focal weakness or numbness.   ____________________________________________   PHYSICAL EXAM:  VITAL SIGNS: ED Triage Vitals  Enc Vitals Group     BP 08/29/19 0158 (!) 159/96     Pulse Rate 08/29/19 0158 98     Resp 08/29/19 0158 20     Temp 08/29/19 0158 97.9 F (36.6 C)     Temp Source 08/29/19 0158 Oral     SpO2 08/29/19 0158 98 %     Weight 08/29/19 0159 69.4 kg (153 lb)     Height 08/29/19 0159 1.499 m (4\' 11" )     Head Circumference --      Peak Flow --      Pain Score 08/29/19 0158 8     Pain Loc --      Pain Edu? --      Excl. in [place]? --     Constitutional: Alert and oriented.  Eyes: Conjunctivae are normal.  Mouth/Throat: Patient is wearing a mask. Neck: No stridor.  No meningeal signs.   Cardiovascular: Normal rate, regular rhythm. Good peripheral circulation. Grossly normal heart sounds. Respiratory:  Normal respiratory effort.  No retractions. Gastrointestinal: Left upper quadrant/left lower quadrant tenderness to palpation.  No distention.   Musculoskeletal: No lower extremity tenderness nor edema. No gross deformities of extremities. Neurologic:  Normal speech and language. No gross focal neurologic deficits are appreciated.  Skin:  Skin is warm, dry and intact. Psychiatric: Mood and affect are normal. Speech and behavior are normal.  ____________________________________________   LABS (all labs  ordered are listed, but only abnormal results are displayed)  Labs Reviewed  CBC - Abnormal; Notable for the following components:      Result Value   WBC 12.0 (*)    All other components within normal limits  COMPREHENSIVE METABOLIC PANEL - Abnormal; Notable for the following components:   CO2 21 (*)    Glucose, Bld 191 (*)    Creatinine, Ser 1.01 (*)    GFR calc non Af Amer 56 (*)    All other components within normal limits  URINALYSIS, COMPLETE (UACMP) WITH MICROSCOPIC - Abnormal; Notable for the following components:   Color, Urine AMBER (*)    APPearance CLOUDY (*)    Hgb urine dipstick LARGE (*)    RBC / HPF >50 (*)    All other components within normal limits  LACTIC ACID, PLASMA     RADIOLOGY I, [place], personally viewed and evaluated these images (plain radiographs) as part of my medical decision making, as well as reviewing the written report by the radiologist.  ED MD interpretation:    Official radiology report(s): CT ABDOMEN PELVIS W CONTRAST  Addendum Date: 08/29/2019   ADDENDUM REPORT: 08/29/2019 06:24 ADDENDUM: This pattern of inflammation is quite nonspecific though does present in and IMA distribution extending from the splenic flexure to the rectum. However, to involve such a large distribution, an acute vascular occlusion would need to be quite proximal to involve the branches so diffusely which is not apparent on this exam. Transient hypotension  or low flow states would more likely affect the watershed distributions Sudeck's point at the rectosigmoid or Griffith's point about the splenic flexure. However, if there is persisting clinical suspicion for possible ischemic colitis, consider obtaining a lactate and correlating with patient's other laboratory values prior to proceeding with further imaging. Addendum called by telephone on 08/29/2019 at 6:22 am to provider [facility] , who verbally acknowledged these results. Electronically Signed   By: Lovena Le M.D.   On: 08/29/2019 06:24   Result Date: 08/29/2019 CLINICAL DATA:  Left lower quadrant pain and rectal bleeding EXAM: CT ABDOMEN AND PELVIS WITH CONTRAST TECHNIQUE: Multidetector CT imaging of the abdomen and pelvis was performed using the standard protocol following bolus administration of intravenous contrast. CONTRAST:  14mL OMNIPAQUE IOHEXOL 300 MG/ML  SOLN COMPARISON:  CT 12/24/2013 FINDINGS: Lower chest: Atelectatic changes are present in the otherwise clear lung bases. Cardiac size at the upper limits of normal. No pericardial effusion. Few coronary artery calcifications are present. Hepatobiliary: Diffuse hepatic hypoattenuation compatible with hepatic steatosis. Sparing seen along the gallbladder fossa. No worrisome focal lesions. Smooth liver surface contour. Normal gallbladder and biliary tree without visible intraductal gallstones. Pancreas: Partial fatty replacement of the pancreas with scattered fatty clefts. No ductal dilatation or inflammation. No concerning pancreatic lesions. Spleen: Normal in size without focal abnormality. Adrenals/Urinary Tract: Normal adrenal glands. Kidneys enhance and excrete symmetrically. Few subcentimeter hypoattenuating foci too small to fully characterize on CT imaging but statistically likely benign. No concerning renal lesions. No urolithiasis or hydronephrosis. Urinary bladder is largely decompressed at the time of exam and therefore poorly  evaluated by CT imaging. No gross bladder abnormality. Stomach/Bowel: Distal esophagus, stomach and duodenum are unremarkable. No small bowel thickening or dilatation. Some mild fecalization of distal small bowel contents without evidence of mechanical obstruction. Cecum displaced into the right upper quadrant. The appendix is surgically absent. Intramural fat within the right colon is a nonspecific finding. There is long segmental thickening of the  colon from the level of the proximal descending to the rectum. There is edematous mural changes and pericolonic stranding. Numerous colonic diverticular seen throughout the sigmoid without focal diverticular inflammation. Vascular/Lymphatic: Minimal atherosclerotic plaque in the abdominal aorta and branch vessels. No visible proximal occlusions within the limitations of this non angiographic technique. Major venous structures are unremarkable. No suspicious or enlarged lymph nodes in the included lymphatic chains. Reproductive: Uterus is surgically absent. No concerning adnexal lesions. Other: No abdominopelvic free fluid or free gas. No bowel containing hernias. Small fat containing umbilical hernia. Musculoskeletal: No acute osseous abnormality or suspicious osseous lesion. Multilevel degenerative changes are present in the imaged portions of the spine. Additional degenerative changes in the hips and pelvis. Mild dextrocurvature of the mid lumbar spine. IMPRESSION: 1. Long segmental thickening of the colon from the level of the proximal descending to the rectum with edematous mural changes and pericolonic stranding, consistent with colitis, which could be infectious, inflammatory or vascular in etiology. 2. Minimal atherosclerotic disease without discernible proximal arterial occlusions within the limitations of this non angiographic technique. 3. Colonic diverticulosis without evidence of acute diverticulitis. 4. Hepatic steatosis. 5. Aortic Atherosclerosis  (ICD10-I70.0). Electronically Signed: By: Lovena Le M.D. On: 08/29/2019 05:29     Procedures   ____________________________________________   INITIAL IMPRESSION / MDM / ASSESSMENT AND PLAN / ED COURSE  As part of my medical decision making, I reviewed the following data within the electronic MEDICAL RECORD NUMBER   69 year old female presented with above-stated history and physical exam a differential diagnosis including but not limited to diverticulitis, colitis, AVM.  Laboratory data notable for white blood cell count of 12.  CT scan of the abdomen pelvis revealed long segment of colonic wall thickening of the proximal descending colon to the rectum with edematous mural changes and pericolonic stranding presenting concern for colitis of infectious ischemic or inflammatory etiology.  I spoke with Dr. Marguerita Merles regarding the CT finding.  Given the fact that the patient has never had a colonoscopy performed an extensive area of colonic wall thickening with concern for possible ischemic versus infectious etiology we will plan to admit the patient for further evaluation. ____________________________________________  FINAL CLINICAL IMPRESSION(S) / ED DIAGNOSES  Final diagnoses:  Colitis  Acute GI bleeding  Lower GI bleed     MEDICATIONS GIVEN DURING THIS VISIT:  Medications  morphine 4 MG/ML injection 4 mg (4 mg Intravenous Given 08/29/19 0456)  ondansetron (ZOFRAN) injection 4 mg (4 mg Intravenous Given 08/29/19 0456)  iohexol (OMNIPAQUE) 300 MG/ML solution 100 mL (100 mLs Intravenous Contrast Given 08/29/19 0504)  sodium chloride 0.9 % bolus 1,000 mL (1,000 mLs Intravenous New Bag/Given 08/29/19 0609)     ED Discharge Orders    None      *Please note:  Adaya Garmany was evaluated in Emergency Department on 08/29/2019 for the symptoms described in the history of present illness. She was evaluated in the context of the global COVID-19 pandemic, which necessitated consideration that the  patient might be at risk for infection with the SARS-CoV-2 virus that causes COVID-19. Institutional protocols and algorithms that pertain to the evaluation of patients at risk for COVID-19 are in a state of rapid change based on information released by regulatory bodies including the CDC and federal and state organizations. These policies and algorithms were followed during the patient's care in the ED.  Some ED evaluations and interventions may be delayed as a result of limited staffing during and after the pandemic.*  Note:  This document was prepared using Dragon voice recognition software and may include unintentional dictation errors.   Gregor Hams, MD 09/03/19 1440

## 2019-08-29 NOTE — ED Notes (Addendum)
Patient ambulated to bathroom independently.  She had small amount red blood in toilet.  Prior to going to bathoom she said she had abdominal cramping.

## 2019-08-29 NOTE — ED Notes (Signed)
Patient in nad.  Iv infusing without problem.

## 2019-08-29 NOTE — ED Triage Notes (Signed)
Pt in with co rectal bleeding that started last night. No hx of the same, pt is not on blood thinners. Pt co lower abd pain, has had multiple episodes but states "it is just clots in the toilet". Denies any hx of hemorrhoids.

## 2019-08-29 NOTE — ED Notes (Signed)
Patient ambulated to bathroom to void.  Says after urinating she got a cramp and had a small amount of blood in toilet.

## 2019-08-29 NOTE — ED Notes (Signed)
This RN to bedside, 2nd IV initiated by this RN, repeat bloodwork collected by this RN. Pt assisted to the toilet ambulatory without assistance. Pt provided with pillow by this RN. This RN explained message sent to admitting MD regarding pain medication. Pt states understanding. Call bell within reach at this time. NAD noted at this time. Lights dimmed for comfort, pt resting in bed playing on cell phone.

## 2019-08-29 NOTE — Consult Note (Signed)
Sarah Gillespie , MD [address], [place], [place], [place], [address] [id number], [place], [place], [place], [address] Phone: [phone]  Fax: [phone]  Consultation  Referring Provider:     Dr Francine Graven  Primary Care Physician:  Delsa Grana, PA-C Primary Gastroenterologist:  None       Reason for Consultation:     Rectal bleeding   Date of Admission:  08/29/2019 Date of Consultation:  08/29/2019         HPI:   Tahra Hitzeman is a 69 y.o. female presented to the ER with rectal bleeding  She says was doing find till yesterday evening when she had some lower abdominal cramping and tried to use the rest room a few times with no result then she got light headed, dizzy , sweaty and was followed by a bloody bowel movement which recurred and hence got admitted. No similar episode in the past , no prior colonoscopy , no NSAID use. Sister has had colon cancer and had a bag after surgery . Today the cramping has improved so has the bowel movement frequency but still has some blood in it.   On admission Hb 14.3 grams with elevated serum lactate. She underwent a CT abdomen and pelvis that demonstrated inflammation from splenic flexure to rectum   Past Medical History:  Diagnosis Date  . Angular cheilitis   . Anxiety   . Cardiac murmur 03/05/2017  . Carotid atherosclerosis   . Carotid bruit   . CKD (chronic kidney disease) stage 2, GFR 60-89 ml/min   . Depression   . Elevated blood pressure   . Frequency   . Grade I diastolic dysfunction 08/10/3708   Echo Jan 2019  . Gross hematuria   . Hyperlipidemia   . Hypertension   . Nocturia   . Pyuria   . Sciatica   . Sigmoid diverticulosis Oct. 2015   CT scan  . Vitamin B12 deficiency   . Vitamin D deficiency disease     Past Surgical History:  Procedure Laterality Date  . ABDOMINAL HYSTERECTOMY  1987  . APPENDECTOMY  1960  . CARPAL TUNNEL RELEASE Left 1988  . EXCISION NEUROMA Left 1988  . TONSILLECTOMY  1962    Prior to  Admission medications   Medication Sig Start Date End Date Taking? Authorizing Provider  aspirin EC 81 MG tablet Take 81 mg by mouth daily.    [provider]  CALCIUM PO Take 500 mg by mouth daily.     [provider]  Cholecalciferol (VITAMIN D) 2000 UNITS CAPS Take 2,000 Units by mouth daily.    [provider]  ezetimibe (ZETIA) 10 MG tablet Take 1 tablet (10 mg total) by mouth daily. 08/11/19   Delsa Grana, PA-C  lisinopril (ZESTRIL) 10 MG tablet Take 1 tablet (10 mg total) by mouth daily. For blood pressure 08/11/19   Delsa Grana, PA-C  rosuvastatin (CRESTOR) 40 MG tablet Take 1 tablet (40 mg total) by mouth daily. 08/11/19   Delsa Grana, PA-C  vitamin B-12 (CYANOCOBALAMIN) 1000 MCG tablet Take 1 tablet by mouth daily. 08/10/16   [provider]  Vitamin D, Ergocalciferol, (DRISDOL) 1.25 MG (50000 UNIT) CAPS capsule Take 1 capsule (50,000 Units total) by mouth every 7 (seven) days. x12 weeks. 08/11/19   Delsa Grana, PA-C    Family History  Problem Relation Age of Onset  . Hypertension Mother   . Heart disease Mother        CHF for months, aortic  stenosis for years  . Clotting disorder Mother   . Aortic stenosis Mother   . Heart failure Mother   . Depression Mother   . Deep vein thrombosis Mother   . Other Sister        muscle disease, polymyositis  . Heart failure Sister   . Pneumonia Sister        cause of death  . ALS Father   . Urolithiasis Father   . Thyroid disease Father   . CVA Father   . Thyroid disease Sister   . Clotting disorder Sister   . Hyperlipidemia Sister   . Migraines Sister   . Deep vein thrombosis Sister   . Cancer Brother 50       lung, brain cancer  . Diabetes Maternal Grandmother   . Stroke Brother   . Heart murmur Brother        rheumatic fever     Social History   Tobacco Use  . Smoking status: Never Smoker  . Smokeless tobacco: Never Used  Vaping Use  . Vaping Use: Never used  Substance Use Topics  .  Alcohol use: Yes    Comment: occasional wine  . Drug use: No    Allergies as of 08/29/2019  . (No Known Allergies)    Review of Systems:    All systems reviewed and negative except where noted in HPI.   Physical Exam:  Vital signs in last 24 hours: Temp:  [97.9 F (36.6 C)] 97.9 F (36.6 C) (06/25 0158) Pulse Rate:  [72-98] 72 (06/25 0702) Resp:  [20] 20 (06/25 0158) BP: (136-159)/(79-96) 136/79 (06/25 0702) SpO2:  [98 %] 98 % (06/25 0702) Weight:  [69.4 kg] 69.4 kg (06/25 0159)   General:   Pleasant, cooperative in NAD Head:  Normocephalic and atraumatic. Eyes:   No icterus.   Conjunctiva pink. PERRLA. Ears:  Normal auditory acuity. Neck:  Supple; no masses or thyroidomegaly Lungs: Respirations even and unlabored. Lungs clear to auscultation bilaterally.   No wheezes, crackles, or rhonchi.  Heart:  Regular rate and rhythm;  Without murmur, clicks, rubs or gallops Abdomen:  Soft, nondistended, nontender. Normal bowel sounds. No appreciable masses or hepatomegaly.  No rebound or guarding.  Neurologic:  Alert and oriented x3;  grossly normal neurologically. Skin:  Intact without significant lesions or rashes. Cervical Nodes:  No significant cervical adenopathy. Psych:  Alert and cooperative. Normal affect.  LAB RESULTS: Recent Labs    08/29/19 0201 08/29/19 0718  WBC 12.0*  --   HGB 15.0 14.3  HCT 44.3 42.4  PLT 323  --    BMET Recent Labs    08/29/19 0201  NA 136  K 4.2  CL 103  CO2 21*  GLUCOSE 191*  BUN 21  CREATININE 1.01*  CALCIUM 9.7   LFT Recent Labs    08/29/19 0201  PROT 7.4  ALBUMIN 4.3  AST 31  ALT 22  ALKPHOS 88  BILITOT 0.6   PT/INR No results for input(s): LABPROT, INR in the last 72 hours.  STUDIES: CT ABDOMEN PELVIS W CONTRAST  Addendum Date: 08/29/2019   ADDENDUM REPORT: 08/29/2019 06:24 ADDENDUM: This pattern of inflammation is quite nonspecific though does present in and IMA distribution extending from the splenic flexure to  the rectum. However, to involve such a large distribution, an acute vascular occlusion would need to be quite proximal to involve the branches so diffusely which is not apparent on this exam. Transient hypotension or low flow states would  more likely affect the watershed distributions Sudeck's point at the rectosigmoid or Griffith's point about the splenic flexure. However, if there is persisting clinical suspicion for possible ischemic colitis, consider obtaining a lactate and correlating with patient's other laboratory values prior to proceeding with further imaging. Addendum called by telephone on 08/29/2019 at 6:22 am to provider [facility] , who verbally acknowledged these results. Electronically Signed   By: Lovena Le M.D.   On: 08/29/2019 06:24   Result Date: 08/29/2019 CLINICAL DATA:  Left lower quadrant pain and rectal bleeding EXAM: CT ABDOMEN AND PELVIS WITH CONTRAST TECHNIQUE: Multidetector CT imaging of the abdomen and pelvis was performed using the standard protocol following bolus administration of intravenous contrast. CONTRAST:  129mL OMNIPAQUE IOHEXOL 300 MG/ML  SOLN COMPARISON:  CT 12/24/2013 FINDINGS: Lower chest: Atelectatic changes are present in the otherwise clear lung bases. Cardiac size at the upper limits of normal. No pericardial effusion. Few coronary artery calcifications are present. Hepatobiliary: Diffuse hepatic hypoattenuation compatible with hepatic steatosis. Sparing seen along the gallbladder fossa. No worrisome focal lesions. Smooth liver surface contour. Normal gallbladder and biliary tree without visible intraductal gallstones. Pancreas: Partial fatty replacement of the pancreas with scattered fatty clefts. No ductal dilatation or inflammation. No concerning pancreatic lesions. Spleen: Normal in size without focal abnormality. Adrenals/Urinary Tract: Normal adrenal glands. Kidneys enhance and excrete symmetrically. Few subcentimeter hypoattenuating foci too small to  fully characterize on CT imaging but statistically likely benign. No concerning renal lesions. No urolithiasis or hydronephrosis. Urinary bladder is largely decompressed at the time of exam and therefore poorly evaluated by CT imaging. No gross bladder abnormality. Stomach/Bowel: Distal esophagus, stomach and duodenum are unremarkable. No small bowel thickening or dilatation. Some mild fecalization of distal small bowel contents without evidence of mechanical obstruction. Cecum displaced into the right upper quadrant. The appendix is surgically absent. Intramural fat within the right colon is a nonspecific finding. There is long segmental thickening of the colon from the level of the proximal descending to the rectum. There is edematous mural changes and pericolonic stranding. Numerous colonic diverticular seen throughout the sigmoid without focal diverticular inflammation. Vascular/Lymphatic: Minimal atherosclerotic plaque in the abdominal aorta and branch vessels. No visible proximal occlusions within the limitations of this non angiographic technique. Major venous structures are unremarkable. No suspicious or enlarged lymph nodes in the included lymphatic chains. Reproductive: Uterus is surgically absent. No concerning adnexal lesions. Other: No abdominopelvic free fluid or free gas. No bowel containing hernias. Small fat containing umbilical hernia. Musculoskeletal: No acute osseous abnormality or suspicious osseous lesion. Multilevel degenerative changes are present in the imaged portions of the spine. Additional degenerative changes in the hips and pelvis. Mild dextrocurvature of the mid lumbar spine. IMPRESSION: 1. Long segmental thickening of the colon from the level of the proximal descending to the rectum with edematous mural changes and pericolonic stranding, consistent with colitis, which could be infectious, inflammatory or vascular in etiology. 2. Minimal atherosclerotic disease without discernible  proximal arterial occlusions within the limitations of this non angiographic technique. 3. Colonic diverticulosis without evidence of acute diverticulitis. 4. Hepatic steatosis. 5. Aortic Atherosclerosis (ICD10-I70.0). Electronically Signed: By: Lovena Le M.D. On: 08/29/2019 05:29      Impression / Plan:   Breanah Faddis is a 69 y.o. y/o female presented with  Rectal bleeding and imaging demonstrated left sided colitis- differentials are ischemic colitis vs ischemic colitis Usually ischemic colitis occurs due to an episode of hypoperfusion/hypotension. Mostly recovers with conservative management with fluids  .  Her history is suggestive of a possible diverticular bleed with secondary hypotension and then subsequent ischemic colitis. Sister has had colon cancer but she has never had a colonoscopy.    Plan  1. Conservative therapy with IV fluid hydration and avoiding hypotensive episodes.  2. Serial abdominal exams and if pain or examination gets worse suggest CT angiogram to r/o mesenteric ischemia  3. Follow up stool studies for GI PCR and C diff to r/o possible infectious colitis  4. Outpatient colonoscopy  5. IF no better in 48 hours then will need flexible sigmoidoscopy    Thank you for involving me in the care of this patient.      LOS: 0 days   Sarah Bellows, MD  08/29/2019, 8:36 AM

## 2019-08-29 NOTE — H&P (Signed)
History and Physical    Sarah Gillespie JQZ:[id number] DOB: [birth year] DOA: 08/29/2019  PCP: Delsa Grana, PA-C   Patient coming from: Home  I have personally briefly reviewed patient's old medical records in [place]  Chief Complaint: Rectal Bleeding  HPI: Sarah Gillespie is a 69 y.o. female with medical history significant for depression, anxiety, hypertension and sigmoid diverticulosis who presents to the emergency room for evaluation of rectal bleeding that started during the night.  Patient states that she was sitting and watching TV when she suddenly felt cramping in her lower abdomen and felt the urge to use the bathroom.  Initially she was constipated but then developed diarrhea that contained blood before it became all bloody stools.  While on the commode she states she felt dizzy and lightheaded and broke out in drenching sweats, she felt she was going to pass out and so called her neighbor.  She continues to have lower abdominal pain which she rates a 7 x 10 in intensity at its worst.  Patient states that she has had multiple episodes of same.  Patient is not on any blood thinners. Labs reveal a hemoglobin of 14.3, lactate of 2.1. Patient had a CT scan of abdomen and pelvis which showed long segmental thickening of the colon from the level of the proximal descending to the rectum with edematous mural changes and pericolonic stranding, consistent with colitis, which could be infectious, inflammatory or vascular in etiology. Minimal atherosclerotic disease without discernible proximal arterial occlusions within the limitations of this non angiographic technique. Colonic diverticulosis without evidence of acute diverticulitis.   ED Course: Patient is a 69 year old female for evaluation of rectal bleeding.  Patient had a CT scan of the abdomen and pelvis which revealed a long segment of colonic wall thickening of the proximal descending colon to the rectum with edematous mural changes  and pericolonic stranding concerning for colitis of infectious, ischemic or inflammatory etiology.  Patient will be admitted to the hospital for further evaluation.  Review of Systems: As per HPI otherwise 10 point review of systems negative.    Past Medical History:  Diagnosis Date  . Angular cheilitis   . Anxiety   . Cardiac murmur 03/05/2017  . Carotid atherosclerosis   . Carotid bruit   . CKD (chronic kidney disease) stage 2, GFR 60-89 ml/min   . Depression   . Elevated blood pressure   . Frequency   . Grade I diastolic dysfunction 08/26/6331   Echo Jan 2019  . Gross hematuria   . Hyperlipidemia   . Hypertension   . Nocturia   . Pyuria   . Sciatica   . Sigmoid diverticulosis Oct. 2015   CT scan  . Vitamin B12 deficiency   . Vitamin D deficiency disease     Past Surgical History:  Procedure Laterality Date  . ABDOMINAL HYSTERECTOMY  1987  . APPENDECTOMY  1960  . CARPAL TUNNEL RELEASE Left 1988  . EXCISION NEUROMA Left 1988  . TONSILLECTOMY  1962     reports that she has never smoked. She has never used smokeless tobacco. She reports current alcohol use. She reports that she does not use drugs.  No Known Allergies  Family History  Problem Relation Age of Onset  . Hypertension Mother   . Heart disease Mother        CHF for months, aortic stenosis for years  . Clotting disorder Mother   . Aortic stenosis Mother   . Heart failure Mother   .  Depression Mother   . Deep vein thrombosis Mother   . Other Sister        muscle disease, polymyositis  . Heart failure Sister   . Pneumonia Sister        cause of death  . ALS Father   . Urolithiasis Father   . Thyroid disease Father   . CVA Father   . Thyroid disease Sister   . Clotting disorder Sister   . Hyperlipidemia Sister   . Migraines Sister   . Deep vein thrombosis Sister   . Cancer Brother 50       lung, brain cancer  . Diabetes Maternal Grandmother   . Stroke Brother   . Heart murmur Brother         rheumatic fever     Prior to Admission medications   Medication Sig Start Date End Date Taking? Authorizing Provider  aspirin EC 81 MG tablet Take 81 mg by mouth daily.    [provider]  CALCIUM PO Take 500 mg by mouth daily.     [provider]  Cholecalciferol (VITAMIN D) 2000 UNITS CAPS Take 2,000 Units by mouth daily.    [provider]  ezetimibe (ZETIA) 10 MG tablet Take 1 tablet (10 mg total) by mouth daily. 08/11/19   Delsa Grana, PA-C  lisinopril (ZESTRIL) 10 MG tablet Take 1 tablet (10 mg total) by mouth daily. For blood pressure 08/11/19   Delsa Grana, PA-C  rosuvastatin (CRESTOR) 40 MG tablet Take 1 tablet (40 mg total) by mouth daily. 08/11/19   Delsa Grana, PA-C  vitamin B-12 (CYANOCOBALAMIN) 1000 MCG tablet Take 1 tablet by mouth daily. 08/10/16   [provider]  Vitamin D, Ergocalciferol, (DRISDOL) 1.25 MG (50000 UNIT) CAPS capsule Take 1 capsule (50,000 Units total) by mouth every 7 (seven) days. x12 weeks. 08/11/19   Delsa Grana, PA-C    Physical Exam: Vitals:   08/29/19 0158 08/29/19 0159 08/29/19 0702  BP: (!) 159/96  136/79  Pulse: 98  72  Resp: 20    Temp: 97.9 F (36.6 C)    TempSrc: Oral    SpO2: 98%  98%  Weight:  69.4 kg   Height:  4\' 11"  (1.499 m)      Vitals:   08/29/19 0158 08/29/19 0159 08/29/19 0702  BP: (!) 159/96  136/79  Pulse: 98  72  Resp: 20    Temp: 97.9 F (36.6 C)    TempSrc: Oral    SpO2: 98%  98%  Weight:  69.4 kg   Height:  4\' 11"  (1.499 m)     Constitutional: NAD, alert and oriented x 3 Eyes: PERRL, lids and conjunctivae normal ENMT: Mucous membranes are moist.  Neck: normal, supple, no masses, no thyromegaly Respiratory: clear to auscultation bilaterally, no wheezing, no crackles. Normal respiratory effort. No accessory muscle use.  Cardiovascular: Regular rate and rhythm, no murmurs / rubs / gallops. No extremity edema. 2+ pedal pulses. No carotid bruits.  Abdomen: tenderness LLQ and  suprapubic area, no masses palpated. No hepatosplenomegaly. Bowel sounds positive.  Musculoskeletal: no clubbing / cyanosis. No joint deformity upper and lower extremities.  Skin: no rashes, lesions, ulcers.  Neurologic: No gross focal neurologic deficit. Psychiatric: Normal mood and affect.   Labs on Admission: I have personally reviewed following labs and imaging studies  CBC: Recent Labs  Lab 08/29/19 0201 08/29/19 0718  WBC 12.0*  --   HGB 15.0 14.3  HCT 44.3 42.4  MCV 91.5  --  PLT 323  --    Basic Metabolic Panel: Recent Labs  Lab 08/29/19 0201  NA 136  K 4.2  CL 103  CO2 21*  GLUCOSE 191*  BUN 21  CREATININE 1.01*  CALCIUM 9.7   GFR: Estimated Creatinine Clearance: 43.9 mL/min (A) (by C-G formula based on SCr of 1.01 mg/dL (H)). Liver Function Tests: Recent Labs  Lab 08/29/19 0201  AST 31  ALT 22  ALKPHOS 88  BILITOT 0.6  PROT 7.4  ALBUMIN 4.3   No results for input(s): LIPASE, AMYLASE in the last 168 hours. No results for input(s): AMMONIA in the last 168 hours. Coagulation Profile: No results for input(s): INR, PROTIME in the last 168 hours. Cardiac Enzymes: No results for input(s): CKTOTAL, CKMB, CKMBINDEX, TROPONINI in the last 168 hours. BNP (last 3 results) No results for input(s): PROBNP in the last 8760 hours. HbA1C: No results for input(s): HGBA1C in the last 72 hours. CBG: No results for input(s): GLUCAP in the last 168 hours. Lipid Profile: No results for input(s): CHOL, HDL, LDLCALC, TRIG, CHOLHDL, LDLDIRECT in the last 72 hours. Thyroid Function Tests: No results for input(s): TSH, T4TOTAL, FREET4, T3FREE, THYROIDAB in the last 72 hours. Anemia Panel: No results for input(s): VITAMINB12, FOLATE, FERRITIN, TIBC, IRON, RETICCTPCT in the last 72 hours. Urine analysis:    Component Value Date/Time   COLORURINE AMBER (A) 08/29/2019 0201   APPEARANCEUR CLOUDY (A) 08/29/2019 0201   LABSPEC 1.024 08/29/2019 0201   PHURINE 5.0  08/29/2019 0201   GLUCOSEU NEGATIVE 08/29/2019 0201   HGBUR LARGE (A) 08/29/2019 0201   BILIRUBINUR NEGATIVE 08/29/2019 0201   KETONESUR NEGATIVE 08/29/2019 0201   PROTEINUR NEGATIVE 08/29/2019 0201   NITRITE NEGATIVE 08/29/2019 0201   LEUKOCYTESUR NEGATIVE 08/29/2019 0201    Radiological Exams on Admission: CT ABDOMEN PELVIS W CONTRAST  Addendum Date: 08/29/2019   ADDENDUM REPORT: 08/29/2019 06:24 ADDENDUM: This pattern of inflammation is quite nonspecific though does present in and IMA distribution extending from the splenic flexure to the rectum. However, to involve such a large distribution, an acute vascular occlusion would need to be quite proximal to involve the branches so diffusely which is not apparent on this exam. Transient hypotension or low flow states would more likely affect the watershed distributions Sudeck's point at the rectosigmoid or Griffith's point about the splenic flexure. However, if there is persisting clinical suspicion for possible ischemic colitis, consider obtaining a lactate and correlating with patient's other laboratory values prior to proceeding with further imaging. Addendum called by telephone on 08/29/2019 at 6:22 am to provider [facility] , who verbally acknowledged these results. Electronically Signed   By: Lovena Le M.D.   On: 08/29/2019 06:24   Result Date: 08/29/2019 CLINICAL DATA:  Left lower quadrant pain and rectal bleeding EXAM: CT ABDOMEN AND PELVIS WITH CONTRAST TECHNIQUE: Multidetector CT imaging of the abdomen and pelvis was performed using the standard protocol following bolus administration of intravenous contrast. CONTRAST:  110mL OMNIPAQUE IOHEXOL 300 MG/ML  SOLN COMPARISON:  CT 12/24/2013 FINDINGS: Lower chest: Atelectatic changes are present in the otherwise clear lung bases. Cardiac size at the upper limits of normal. No pericardial effusion. Few coronary artery calcifications are present. Hepatobiliary: Diffuse hepatic hypoattenuation  compatible with hepatic steatosis. Sparing seen along the gallbladder fossa. No worrisome focal lesions. Smooth liver surface contour. Normal gallbladder and biliary tree without visible intraductal gallstones. Pancreas: Partial fatty replacement of the pancreas with scattered fatty clefts. No ductal dilatation or inflammation. No concerning pancreatic  lesions. Spleen: Normal in size without focal abnormality. Adrenals/Urinary Tract: Normal adrenal glands. Kidneys enhance and excrete symmetrically. Few subcentimeter hypoattenuating foci too small to fully characterize on CT imaging but statistically likely benign. No concerning renal lesions. No urolithiasis or hydronephrosis. Urinary bladder is largely decompressed at the time of exam and therefore poorly evaluated by CT imaging. No gross bladder abnormality. Stomach/Bowel: Distal esophagus, stomach and duodenum are unremarkable. No small bowel thickening or dilatation. Some mild fecalization of distal small bowel contents without evidence of mechanical obstruction. Cecum displaced into the right upper quadrant. The appendix is surgically absent. Intramural fat within the right colon is a nonspecific finding. There is long segmental thickening of the colon from the level of the proximal descending to the rectum. There is edematous mural changes and pericolonic stranding. Numerous colonic diverticular seen throughout the sigmoid without focal diverticular inflammation. Vascular/Lymphatic: Minimal atherosclerotic plaque in the abdominal aorta and branch vessels. No visible proximal occlusions within the limitations of this non angiographic technique. Major venous structures are unremarkable. No suspicious or enlarged lymph nodes in the included lymphatic chains. Reproductive: Uterus is surgically absent. No concerning adnexal lesions. Other: No abdominopelvic free fluid or free gas. No bowel containing hernias. Small fat containing umbilical hernia. Musculoskeletal:  No acute osseous abnormality or suspicious osseous lesion. Multilevel degenerative changes are present in the imaged portions of the spine. Additional degenerative changes in the hips and pelvis. Mild dextrocurvature of the mid lumbar spine. IMPRESSION: 1. Long segmental thickening of the colon from the level of the proximal descending to the rectum with edematous mural changes and pericolonic stranding, consistent with colitis, which could be infectious, inflammatory or vascular in etiology. 2. Minimal atherosclerotic disease without discernible proximal arterial occlusions within the limitations of this non angiographic technique. 3. Colonic diverticulosis without evidence of acute diverticulitis. 4. Hepatic steatosis. 5. Aortic Atherosclerosis (ICD10-I70.0). Electronically Signed: By: Lovena Le M.D. On: 08/29/2019 05:29    EKG: Independently reviewed.   Assessment/Plan Principal Problem:   Colitis with rectal bleeding Active Problems:   Essential hypertension, benign   Sigmoid diverticulosis   Major depressive disorder, recurrent episode, moderate (HCC)    Colitis with rectal bleeding Patient presents for sudden onset bleeding per rectum which she describes as bright red blood with clots associated with lower abdominal pain She has an elevated lactic acid level of 2.1 and hemoglobin is stable at 14g/dl Concern for possible ischemic colitis Will obtain serial H&H We will consult GI   Hypertension Blood pressure stable Hold all oral antihypertensive medications for now As needed hydralazine for systolic blood pressure greater than 160      DVT prophylaxis: SCD Code Status: Full Code Family Communication: Greater than 50% of time was spent discussing plan of care with patient at bedside.  She verbalizes understanding and agrees with the plan. Disposition Plan: Back to previous home environment Consults called: GI    Escher Harr MD [facility]     08/29/2019,  7:49 AM

## 2019-08-29 NOTE — ED Notes (Signed)
Patient is in nad with no complaints.  Her iv is infusing without problem

## 2019-08-29 NOTE — ED Notes (Signed)
Date and time results received: 08/29/19 7:29 AM  (use smartphrase ".now" to insert current time)  Test: Lactic Critical Value: 2.1  Name of Provider Notified: Paduchowski  Orders Received? Or Actions Taken?: Critical results acknowledged

## 2019-08-29 NOTE — ED Notes (Signed)
Pt states she had bloody stool in the lobby, small clot noted in toilet.

## 2019-08-30 DIAGNOSIS — Z7982 Long term (current) use of aspirin: Secondary | ICD-10-CM | POA: Diagnosis not present

## 2019-08-30 DIAGNOSIS — E785 Hyperlipidemia, unspecified: Secondary | ICD-10-CM

## 2019-08-30 DIAGNOSIS — K5731 Diverticulosis of large intestine without perforation or abscess with bleeding: Secondary | ICD-10-CM | POA: Diagnosis present

## 2019-08-30 DIAGNOSIS — N182 Chronic kidney disease, stage 2 (mild): Secondary | ICD-10-CM | POA: Diagnosis present

## 2019-08-30 DIAGNOSIS — K529 Noninfective gastroenteritis and colitis, unspecified: Secondary | ICD-10-CM | POA: Diagnosis not present

## 2019-08-30 DIAGNOSIS — K559 Vascular disorder of intestine, unspecified: Secondary | ICD-10-CM

## 2019-08-30 DIAGNOSIS — F331 Major depressive disorder, recurrent, moderate: Secondary | ICD-10-CM | POA: Diagnosis present

## 2019-08-30 DIAGNOSIS — K5900 Constipation, unspecified: Secondary | ICD-10-CM | POA: Diagnosis present

## 2019-08-30 DIAGNOSIS — Z20822 Contact with and (suspected) exposure to covid-19: Secondary | ICD-10-CM | POA: Diagnosis present

## 2019-08-30 DIAGNOSIS — K922 Gastrointestinal hemorrhage, unspecified: Secondary | ICD-10-CM | POA: Diagnosis not present

## 2019-08-30 DIAGNOSIS — I959 Hypotension, unspecified: Secondary | ICD-10-CM | POA: Diagnosis present

## 2019-08-30 DIAGNOSIS — Z90710 Acquired absence of both cervix and uterus: Secondary | ICD-10-CM | POA: Diagnosis not present

## 2019-08-30 DIAGNOSIS — K558 Other vascular disorders of intestine: Secondary | ICD-10-CM | POA: Diagnosis present

## 2019-08-30 DIAGNOSIS — Z79899 Other long term (current) drug therapy: Secondary | ICD-10-CM | POA: Diagnosis not present

## 2019-08-30 DIAGNOSIS — I10 Essential (primary) hypertension: Secondary | ICD-10-CM | POA: Diagnosis not present

## 2019-08-30 DIAGNOSIS — R1032 Left lower quadrant pain: Secondary | ICD-10-CM

## 2019-08-30 DIAGNOSIS — K5150 Left sided colitis without complications: Secondary | ICD-10-CM | POA: Diagnosis present

## 2019-08-30 DIAGNOSIS — K921 Melena: Secondary | ICD-10-CM | POA: Diagnosis present

## 2019-08-30 DIAGNOSIS — I129 Hypertensive chronic kidney disease with stage 1 through stage 4 chronic kidney disease, or unspecified chronic kidney disease: Secondary | ICD-10-CM | POA: Diagnosis present

## 2019-08-30 LAB — C DIFFICILE QUICK SCREEN W PCR REFLEX
C Diff antigen: NEGATIVE
C Diff interpretation: NOT DETECTED
C Diff toxin: NEGATIVE

## 2019-08-30 LAB — BASIC METABOLIC PANEL
Anion gap: 9 (ref 5–15)
BUN: 11 mg/dL (ref 8–23)
CO2: 22 mmol/L (ref 22–32)
Calcium: 8.3 mg/dL — ABNORMAL LOW (ref 8.9–10.3)
Chloride: 106 mmol/L (ref 98–111)
Creatinine, Ser: 0.9 mg/dL (ref 0.44–1.00)
GFR calc Af Amer: >60 mL/min (ref >60)
GFR calc non Af Amer: >60 mL/min (ref >60)
Glucose, Bld: 96 mg/dL (ref 70–99)
Potassium: 3.8 mmol/L (ref 3.5–5.1)
Sodium: 137 mmol/L (ref 135–145)

## 2019-08-30 LAB — HEMOGLOBIN: Hemoglobin: 12.4 g/dL (ref 12.0–15.0)

## 2019-08-30 LAB — HEMOGLOBIN AND HEMATOCRIT, BLOOD
HCT: 37.9 % (ref 36.0–46.0)
Hemoglobin: 12.7 g/dL (ref 12.0–15.0)

## 2019-08-30 MED ORDER — OXYCODONE HCL 5 MG PO TABS
5.0000 mg | ORAL_TABLET | Freq: Four times a day (QID) | ORAL | Status: DC | PRN
  Administered 2019-08-30: 5 mg via ORAL
  Filled 2019-08-30: qty 1

## 2019-08-30 MED ORDER — MORPHINE SULFATE (PF) 2 MG/ML IV SOLN: 2.0000 mg | INTRAVENOUS | Status: DC | PRN

## 2019-08-30 MED ORDER — FAMOTIDINE 20 MG PO TABS
20.0000 mg | ORAL_TABLET | Freq: Every day | ORAL | Status: DC
  Administered 2019-08-30 – 2019-08-31 (×2): 20 mg via ORAL
  Filled 2019-08-30 (×2): qty 1

## 2019-08-30 NOTE — Progress Notes (Signed)
Patient ID: Sarah Gillespie, female   DOB: 1950/06/13, 69 y.o.   MRN: [id number] [facility] PROGRESS NOTE  Sarah Gillespie RXV:[id number] DOB: 04-Jul-1950 DOA: 08/29/2019 PCP: Delsa Grana, PA-C  HPI/Subjective: Patient came in with abdominal pain and bleeding.  She states that initially she had constipation and then had diarrhea then bleeding.  Now she is just having bleeding.  Not much blood at a time.  Still having abdominal pain but less intense than yesterday.  Objective: Vitals:   08/30/19 0904 08/30/19 1302  BP: 127/78 133/62  Pulse: 88 87  Resp: 18 17  Temp: 99.1 F (37.3 C) 99 F (37.2 C)  SpO2: 96% 97%    Intake/Output Summary (Last 24 hours) at 08/30/2019 1409 Last data filed at 08/30/2019 1200 Gross per 24 hour  Intake 2756.84 ml  Output 500 ml  Net 2256.84 ml   Filed Weights   08/29/19 0159  Weight: 69.4 kg    ROS: Review of Systems  Respiratory: Negative for shortness of breath.   Cardiovascular: Negative for chest pain.  Gastrointestinal: Positive for abdominal pain, blood in stool and diarrhea.   Exam: Physical Exam HENT:     Nose: No mucosal edema.     Mouth/Throat:     Pharynx: No oropharyngeal exudate.  Eyes:     General: Lids are normal.     Conjunctiva/sclera: Conjunctivae normal.     Pupils: Pupils are equal, round, and reactive to light.  Cardiovascular:     Rate and Rhythm: Normal rate and regular rhythm.     Heart sounds: S1 normal and S2 normal. No murmur heard.   Pulmonary:     Effort: No respiratory distress.     Breath sounds: No wheezing, rhonchi or rales.  Abdominal:     Palpations: Abdomen is soft.     Tenderness: There is abdominal tenderness in the suprapubic area and left lower quadrant.  Musculoskeletal:     Right ankle: No swelling.     Left ankle: No swelling.  Skin:    General: Skin is warm.     Findings: No rash.  Neurological:     Mental Status: She is alert.     Cranial Nerves: No cranial nerve deficit.        Data Reviewed: Basic Metabolic Panel: Recent Labs  Lab 08/29/19 0201 08/30/19 0757  NA 136 137  K 4.2 3.8  CL 103 106  CO2 21* 22  GLUCOSE 191* 96  BUN 21 11  CREATININE 1.01* 0.90  CALCIUM 9.7 8.3*   Liver Function Tests: Recent Labs  Lab 08/29/19 0201  AST 31  ALT 22  ALKPHOS 88  BILITOT 0.6  PROT 7.4  ALBUMIN 4.3   CBC: Recent Labs  Lab 08/29/19 0201 08/29/19 0718 08/29/19 1722 08/30/19 1037  WBC 12.0*  --   --   --   HGB 15.0 14.3 14.4 12.7  HCT 44.3 42.4 43.3 37.9  MCV 91.5  --   --   --   PLT 323  --   --   --      Recent Results (from the past 240 hour(s))  SARS Coronavirus 2 by RT PCR (hospital order, performed in [facility] hospital lab) Nasopharyngeal Nasopharyngeal Swab     Status: None   Collection Time: 08/29/19  7:32 AM   Specimen: Nasopharyngeal Swab  Result Value Ref Range Status   SARS Coronavirus 2 NEGATIVE NEGATIVE Final    Comment: (NOTE) SARS-CoV-2 target nucleic acids are NOT DETECTED.  The  SARS-CoV-2 RNA is generally detectable in upper and lower respiratory specimens during the acute phase of infection. The lowest concentration of SARS-CoV-2 viral copies this assay can detect is 250 copies / mL. A negative result does not preclude SARS-CoV-2 infection and should not be used as the sole basis for treatment or other patient management decisions.  A negative result may occur with improper specimen collection / handling, submission of specimen other than nasopharyngeal swab, presence of viral mutation(s) within the areas targeted by this assay, and inadequate number of viral copies (<250 copies / mL). A negative result must be combined with clinical observations, patient history, and epidemiological information.  Fact Sheet for Patients:   StrictlyIdeas.no  Fact Sheet for Healthcare Providers: BankingDealers.co.za  This test is not yet approved or  cleared by the [place] FDA and has been authorized for detection and/or diagnosis of SARS-CoV-2 by FDA under an Emergency Use Authorization (EUA).  This EUA will remain in effect (meaning this test can be used) for the duration of the COVID-19 declaration under Section 564(b)(1) of the Act, 21 U.S.C. section 360bbb-3(b)(1), unless the authorization is terminated or revoked sooner.  Performed at [facility], [place], [place] [address]   C Difficile Quick Screen w PCR reflex     Status: None   Collection Time: 08/30/19  7:28 AM   Specimen: STOOL  Result Value Ref Range Status   C Diff antigen NEGATIVE NEGATIVE Final   C Diff toxin NEGATIVE NEGATIVE Final   C Diff interpretation No C. difficile detected.  Final    Comment: Performed at [facility], [address] [place], [place] [address]     Studies: CT ABDOMEN PELVIS W CONTRAST  Addendum Date: 08/29/2019   ADDENDUM REPORT: 08/29/2019 06:24 ADDENDUM: This pattern of inflammation is quite nonspecific though does present in and IMA distribution extending from the splenic flexure to the rectum. However, to involve such a large distribution, an acute vascular occlusion would need to be quite proximal to involve the branches so diffusely which is not apparent on this exam. Transient hypotension or low flow states would more likely affect the watershed distributions Sudeck's point at the rectosigmoid or Griffith's point about the splenic flexure. However, if there is persisting clinical suspicion for possible ischemic colitis, consider obtaining a lactate and correlating with patient's other laboratory values prior to proceeding with further imaging. Addendum called by telephone on 08/29/2019 at 6:22 am to provider [facility] , who verbally acknowledged these results. Electronically Signed   By: Lovena Le M.D.   On: 08/29/2019 06:24   Result Date: 08/29/2019 CLINICAL DATA:  Left lower quadrant pain and rectal bleeding  EXAM: CT ABDOMEN AND PELVIS WITH CONTRAST TECHNIQUE: Multidetector CT imaging of the abdomen and pelvis was performed using the standard protocol following bolus administration of intravenous contrast. CONTRAST:  155mL OMNIPAQUE IOHEXOL 300 MG/ML  SOLN COMPARISON:  CT 12/24/2013 FINDINGS: Lower chest: Atelectatic changes are present in the otherwise clear Gillespie bases. Cardiac size at the upper limits of normal. No pericardial effusion. Few coronary artery calcifications are present. Hepatobiliary: Diffuse hepatic hypoattenuation compatible with hepatic steatosis. Sparing seen along the gallbladder fossa. No worrisome focal lesions. Smooth liver surface contour. Normal gallbladder and biliary tree without visible intraductal gallstones. Pancreas: Partial fatty replacement of the pancreas with scattered fatty clefts. No ductal dilatation or inflammation. No concerning pancreatic lesions. Spleen: Normal in size without focal abnormality. Adrenals/Urinary Tract: Normal adrenal glands. Kidneys enhance and excrete  symmetrically. Few subcentimeter hypoattenuating foci too small to fully characterize on CT imaging but statistically likely benign. No concerning renal lesions. No urolithiasis or hydronephrosis. Urinary bladder is largely decompressed at the time of exam and therefore poorly evaluated by CT imaging. No gross bladder abnormality. Stomach/Bowel: Distal esophagus, stomach and duodenum are unremarkable. No small bowel thickening or dilatation. Some mild fecalization of distal small bowel contents without evidence of mechanical obstruction. Cecum displaced into the right upper quadrant. The appendix is surgically absent. Intramural fat within the right colon is a nonspecific finding. There is long segmental thickening of the colon from the level of the proximal descending to the rectum. There is edematous mural changes and pericolonic stranding. Numerous colonic diverticular seen throughout the sigmoid without focal  diverticular inflammation. Vascular/Lymphatic: Minimal atherosclerotic plaque in the abdominal aorta and branch vessels. No visible proximal occlusions within the limitations of this non angiographic technique. Major venous structures are unremarkable. No suspicious or enlarged lymph nodes in the included lymphatic chains. Reproductive: Uterus is surgically absent. No concerning adnexal lesions. Other: No abdominopelvic free fluid or free gas. No bowel containing hernias. Small fat containing umbilical hernia. Musculoskeletal: No acute osseous abnormality or suspicious osseous lesion. Multilevel degenerative changes are present in the imaged portions of the spine. Additional degenerative changes in the hips and pelvis. Mild dextrocurvature of the mid lumbar spine. IMPRESSION: 1. Long segmental thickening of the colon from the level of the proximal descending to the rectum with edematous mural changes and pericolonic stranding, consistent with colitis, which could be infectious, inflammatory or vascular in etiology. 2. Minimal atherosclerotic disease without discernible proximal arterial occlusions within the limitations of this non angiographic technique. 3. Colonic diverticulosis without evidence of acute diverticulitis. 4. Hepatic steatosis. 5. Aortic Atherosclerosis (ICD10-I70.0). Electronically Signed: By: Lovena Le M.D. On: 08/29/2019 05:29    Scheduled Meds: . ezetimibe  10 mg Oral Daily  . pantoprazole (PROTONIX) IV  40 mg Intravenous Q24H  . rosuvastatin  40 mg Oral Daily  . sodium chloride flush  3 mL Intravenous Q12H   Continuous Infusions: . sodium chloride 100 mL/hr at 08/30/19 0657    Assessment/Plan:  1. Abdominal pain with lower GI bleed with bright red blood per rectum.  Patient having lower abdominal pain in the left lower quadrant.  Colitis seen on CT scan.  Stool studies ordered.  Serial hemoglobins.  Last hemoglobin did dip down a little bit to 12.7.  Drop rate of IV fluids.   Seen by GI and conservative management at this point.  They recommended outpatient colonoscopy but this does not improve may need a flexible sigmoidoscopy.  Hold aspirin. 2. Essential hypertension hold antihypertensive medications 3. Hyperlipidemia on Crestor and Zetia. 4. Patient also states that she gets these episodes of sweating, flushing, headache and feeling like she is going to pass out.  We will send off a 24-hour urine for metanephrines and 5-HIAA.    Code Status:     Code Status Orders  (From admission, onward)         Start     Ordered   08/29/19 0815  Full code  Continuous        08/29/19 0817        Code Status History    Date Active Date Inactive Code Status Order ID Comments User Context   04/05/2017 1156 08/29/2019 0152 DNR [id number]  Arnetha Courser, MD Outpatient   Advance Care Planning Activity     Disposition Plan: Status is: Inpatient  Dispo: The patient is from: Home              Anticipated d/c is to: Home              Anticipated d/c date is: In a few days once bleeding subsided              Patient currently being followed closely here in the hospital for drop in hemoglobin and bleeding.  Could be colitis as the cause versus diverticular bleed.  Consultants:  Gastroenterology  Time spent: 28 minutes  [place]

## 2019-08-30 NOTE — Progress Notes (Signed)
Sarah Darby, MD [address]  [place]  [place], [place] [address]  Main: [phone]  Fax: [phone] Pager: [phone]   Subjective: Patient reports feeling better today.  She has seen minimal amount of blood today compared to yesterday.  She does report lower abdominal cramps after she ate today.  Overall, feels better.  Hemoglobin dropped from 14.4-12.7 in last 24 hours Patient reports episodes of sweating, dizziness associated with onset of lower abdominal pain when she had similar episodes in the past   Objective: Vital signs in last 24 hours: Vitals:   08/30/19 0054 08/30/19 0451 08/30/19 0904 08/30/19 1302  BP: (!) 150/73 131/70 127/78 133/62  Pulse: 88 89 88 87  Resp: 18 17 18 17   Temp:   99.1 F (37.3 C) 99 F (37.2 C)  TempSrc:   Oral Oral  SpO2: 97% 94% 96% 97%  Weight:      Height:       Weight change:   Intake/Output Summary (Last 24 hours) at 08/30/2019 1440 Last data filed at 08/30/2019 1200 Gross per 24 hour  Intake 2756.84 ml  Output 500 ml  Net 2256.84 ml     Exam: Heart:: Regular rate and rhythm, S1S2 present or without murmur or extra heart sounds Lungs: normal and clear to auscultation Abdomen: Soft, mild lower abdominal tenderness, nondistended   Lab Results: CBC Latest Ref Rng & Units 08/30/2019 08/29/2019 08/29/2019  WBC 4.0 - 10.5 K/uL - - -  Hemoglobin 12.0 - 15.0 g/dL 12.7 14.4 14.3  Hematocrit 36 - 46 % 37.9 43.3 42.4  Platelets 150 - 400 K/uL - - -   CMP Latest Ref Rng & Units 08/30/2019 08/29/2019 08/08/2019  Glucose 70 - 99 mg/dL 96 191(H) 93  BUN 8 - 23 mg/dL 11 21 20   Creatinine 0.44 - 1.00 mg/dL 0.90 1.01(H) 1.01(H)  Sodium 135 - 145 mmol/L 137 136 139  Potassium 3.5 - 5.1 mmol/L 3.8 4.2 4.1  Chloride 98 - 111 mmol/L 106 103 105  CO2 22 - 32 mmol/L 22 21(L) 23  Calcium 8.9 - 10.3 mg/dL 8.3(L) 9.7 9.6  Total Protein 6.5 - 8.1 g/dL - 7.4 6.3  Total Bilirubin 0.3 - 1.2 mg/dL - 0.6 0.4  Alkaline Phos 38 - 126 U/L  - 88 -  AST 15 - 41 U/L - 31 17  ALT 0 - 44 U/L - 22 11    Micro Results: Recent Results (from the past 240 hour(s))  SARS Coronavirus 2 by RT PCR (hospital order, performed in [facility] hospital lab) Nasopharyngeal Nasopharyngeal Swab     Status: None   Collection Time: 08/29/19  7:32 AM   Specimen: Nasopharyngeal Swab  Result Value Ref Range Status   SARS Coronavirus 2 NEGATIVE NEGATIVE Final    Comment: (NOTE) SARS-CoV-2 target nucleic acids are NOT DETECTED.  The SARS-CoV-2 RNA is generally detectable in upper and lower respiratory specimens during the acute phase of infection. The lowest concentration of SARS-CoV-2 viral copies this assay can detect is 250 copies / mL. A negative result does not preclude SARS-CoV-2 infection and should not be used as the sole basis for treatment or other patient management decisions.  A negative result may occur with improper specimen collection / handling, submission of specimen other than nasopharyngeal swab, presence of viral mutation(s) within the areas targeted by this assay, and inadequate number of viral copies (<250 copies / mL). A negative result must be combined with clinical observations, patient history, and  epidemiological information.  Fact Sheet for Patients:   StrictlyIdeas.no  Fact Sheet for Healthcare Providers: BankingDealers.co.za  This test is not yet approved or  cleared by the [place] FDA and has been authorized for detection and/or diagnosis of SARS-CoV-2 by FDA under an Emergency Use Authorization (EUA).  This EUA will remain in effect (meaning this test can be used) for the duration of the COVID-19 declaration under Section 564(b)(1) of the Act, 21 U.S.C. section 360bbb-3(b)(1), unless the authorization is terminated or revoked sooner.  Performed at [facility], [place], [place] [address]   C Difficile Quick Screen w PCR reflex      Status: None   Collection Time: 08/30/19  7:28 AM   Specimen: STOOL  Result Value Ref Range Status   C Diff antigen NEGATIVE NEGATIVE Final   C Diff toxin NEGATIVE NEGATIVE Final   C Diff interpretation No C. difficile detected.  Final    Comment: Performed at [facility], [address] [place], [place] [address]   Studies/Results: CT ABDOMEN PELVIS W CONTRAST  Addendum Date: 08/29/2019   ADDENDUM REPORT: 08/29/2019 06:24 ADDENDUM: This pattern of inflammation is quite nonspecific though does present in and IMA distribution extending from the splenic flexure to the rectum. However, to involve such a large distribution, an acute vascular occlusion would need to be quite proximal to involve the branches so diffusely which is not apparent on this exam. Transient hypotension or low flow states would more likely affect the watershed distributions Sudeck's point at the rectosigmoid or Griffith's point about the splenic flexure. However, if there is persisting clinical suspicion for possible ischemic colitis, consider obtaining a lactate and correlating with patient's other laboratory values prior to proceeding with further imaging. Addendum called by telephone on 08/29/2019 at 6:22 am to provider [facility] , who verbally acknowledged these results. Electronically Signed   By: Lovena Le M.D.   On: 08/29/2019 06:24   Result Date: 08/29/2019 CLINICAL DATA:  Left lower quadrant pain and rectal bleeding EXAM: CT ABDOMEN AND PELVIS WITH CONTRAST TECHNIQUE: Multidetector CT imaging of the abdomen and pelvis was performed using the standard protocol following bolus administration of intravenous contrast. CONTRAST:  138mL OMNIPAQUE IOHEXOL 300 MG/ML  SOLN COMPARISON:  CT 12/24/2013 FINDINGS: Lower chest: Atelectatic changes are present in the otherwise clear lung bases. Cardiac size at the upper limits of normal. No pericardial effusion. Few coronary artery calcifications are present.  Hepatobiliary: Diffuse hepatic hypoattenuation compatible with hepatic steatosis. Sparing seen along the gallbladder fossa. No worrisome focal lesions. Smooth liver surface contour. Normal gallbladder and biliary tree without visible intraductal gallstones. Pancreas: Partial fatty replacement of the pancreas with scattered fatty clefts. No ductal dilatation or inflammation. No concerning pancreatic lesions. Spleen: Normal in size without focal abnormality. Adrenals/Urinary Tract: Normal adrenal glands. Kidneys enhance and excrete symmetrically. Few subcentimeter hypoattenuating foci too small to fully characterize on CT imaging but statistically likely benign. No concerning renal lesions. No urolithiasis or hydronephrosis. Urinary bladder is largely decompressed at the time of exam and therefore poorly evaluated by CT imaging. No gross bladder abnormality. Stomach/Bowel: Distal esophagus, stomach and duodenum are unremarkable. No small bowel thickening or dilatation. Some mild fecalization of distal small bowel contents without evidence of mechanical obstruction. Cecum displaced into the right upper quadrant. The appendix is surgically absent. Intramural fat within the right colon is a nonspecific finding. There is long segmental thickening of the colon from the level of the proximal descending to  the rectum. There is edematous mural changes and pericolonic stranding. Numerous colonic diverticular seen throughout the sigmoid without focal diverticular inflammation. Vascular/Lymphatic: Minimal atherosclerotic plaque in the abdominal aorta and branch vessels. No visible proximal occlusions within the limitations of this non angiographic technique. Major venous structures are unremarkable. No suspicious or enlarged lymph nodes in the included lymphatic chains. Reproductive: Uterus is surgically absent. No concerning adnexal lesions. Other: No abdominopelvic free fluid or free gas. No bowel containing hernias. Small fat  containing umbilical hernia. Musculoskeletal: No acute osseous abnormality or suspicious osseous lesion. Multilevel degenerative changes are present in the imaged portions of the spine. Additional degenerative changes in the hips and pelvis. Mild dextrocurvature of the mid lumbar spine. IMPRESSION: 1. Long segmental thickening of the colon from the level of the proximal descending to the rectum with edematous mural changes and pericolonic stranding, consistent with colitis, which could be infectious, inflammatory or vascular in etiology. 2. Minimal atherosclerotic disease without discernible proximal arterial occlusions within the limitations of this non angiographic technique. 3. Colonic diverticulosis without evidence of acute diverticulitis. 4. Hepatic steatosis. 5. Aortic Atherosclerosis (ICD10-I70.0). Electronically Signed: By: Lovena Le M.D. On: 08/29/2019 05:29   Medications:  I have reviewed the patient's current medications. Prior to Admission:  Medications Prior to Admission  Medication Sig Dispense Refill Last Dose  . aspirin EC 81 MG tablet Take 81 mg by mouth daily.   Past Week at 2130  . CALCIUM PO Take 500 mg by mouth daily.    Past Week at 2130  . Cholecalciferol (VITAMIN D) 2000 UNITS CAPS Take 2,000 Units by mouth daily.   Past Week at 2130  . ezetimibe (ZETIA) 10 MG tablet Take 1 tablet (10 mg total) by mouth daily. 90 tablet 3 Past Week at 2130  . lisinopril (ZESTRIL) 10 MG tablet Take 1 tablet (10 mg total) by mouth daily. For blood pressure 90 tablet 3 Past Week at 2130  . rosuvastatin (CRESTOR) 40 MG tablet Take 1 tablet (40 mg total) by mouth daily. 90 tablet 3 Past Week at 2130  . vitamin B-12 (CYANOCOBALAMIN) 1000 MCG tablet Take 1 tablet by mouth daily.   Past Week at 2130  . Vitamin D, Ergocalciferol, (DRISDOL) 1.25 MG (50000 UNIT) CAPS capsule Take 1 capsule (50,000 Units total) by mouth every 7 (seven) days. x12 weeks. 12 capsule 0 08/27/2019 at 2130   Scheduled: .  ezetimibe  10 mg Oral Daily  . famotidine  20 mg Oral Daily  . rosuvastatin  40 mg Oral Daily  . sodium chloride flush  3 mL Intravenous Q12H   Continuous: . sodium chloride 100 mL/hr at 08/30/19 0657   [facility]:[id number], morphine injection, ondansetron **OR** ondansetron (ZOFRAN) IV Anti-infectives (From admission, onward)   None     Scheduled Meds: . ezetimibe  10 mg Oral Daily  . famotidine  20 mg Oral Daily  . rosuvastatin  40 mg Oral Daily  . sodium chloride flush  3 mL Intravenous Q12H   Continuous Infusions: . sodium chloride 100 mL/hr at 08/30/19 0657   PRN Meds:.hydrALAZINE, morphine injection, ondansetron **OR** ondansetron (ZOFRAN) IV   Assessment: Principal Problem:   Colitis Active Problems:   Essential hypertension, benign   Sigmoid diverticulosis   Major depressive disorder, recurrent episode, moderate (HCC)   Lower GI bleed   Left lower quadrant abdominal pain   Plan: Left-sided colitis, likely ischemic colitis, no evidence of obvious vascular stenosis based on CT Clinically improving, bleeding has significantly improved Okay to  resume aspirin 81 mg daily Discontinue IV Protonix, okay to start Pepcid daily Advance diet as tolerated Plan for outpatient colonoscopy by Dr. Vicente Males in 3 to 4 weeks Symptoms of dizziness, flushing and sweating during episodes of abdominal pain are likely due to vasovagal attacks Likely home tomorrow  Dr. Vicente Males will cover from tomorrow   LOS: 0 days   Berneta Sconyers 08/30/2019, 2:40 PM

## 2019-08-31 LAB — BASIC METABOLIC PANEL
Anion gap: 6 (ref 5–15)
BUN: 10 mg/dL (ref 8–23)
CO2: 24 mmol/L (ref 22–32)
Calcium: 8.2 mg/dL — ABNORMAL LOW (ref 8.9–10.3)
Chloride: 109 mmol/L (ref 98–111)
Creatinine, Ser: 0.98 mg/dL (ref 0.44–1.00)
GFR calc Af Amer: >60 mL/min (ref >60)
GFR calc non Af Amer: 58 mL/min — ABNORMAL LOW (ref >60)
Glucose, Bld: 99 mg/dL (ref 70–99)
Potassium: 3.8 mmol/L (ref 3.5–5.1)
Sodium: 139 mmol/L (ref 135–145)

## 2019-08-31 LAB — CBC
HCT: 35.2 % — ABNORMAL LOW (ref 36.0–46.0)
Hemoglobin: 12 g/dL (ref 12.0–15.0)
MCH: 31.2 pg (ref 26.0–34.0)
MCHC: 34.1 g/dL (ref 30.0–36.0)
MCV: 91.4 fL (ref 80.0–100.0)
Platelets: 218 10*3/uL (ref 150–400)
RBC: 3.85 MIL/uL — ABNORMAL LOW (ref 3.87–5.11)
RDW: 14.3 % (ref 11.5–15.5)
WBC: 9.7 10*3/uL (ref 4.0–10.5)
nRBC: 0 % (ref 0.0–0.2)

## 2019-08-31 MED ORDER — FAMOTIDINE 20 MG PO TABS: 20.0000 mg | ORAL_TABLET | Freq: Every day | ORAL | 0 refills | Status: DC

## 2019-08-31 MED ORDER — POLYETHYLENE GLYCOL 3350 17 G PO PACK: 17.0000 g | PACK | Freq: Every day | ORAL | 0 refills | Status: AC | PRN

## 2019-08-31 MED ORDER — POLYETHYLENE GLYCOL 3350 17 G PO PACK: 17.0000 g | PACK | Freq: Every day | ORAL | Status: DC | PRN

## 2019-08-31 MED ORDER — OXYCODONE HCL 5 MG PO TABS: 5.0000 mg | ORAL_TABLET | Freq: Four times a day (QID) | ORAL | 0 refills | Status: DC | PRN

## 2019-08-31 NOTE — Discharge Summary (Signed)
[facility] NAME: Sarah Gillespie    MR#:  [id number]  DATE OF BIRTH:  09-01-1950  DATE OF ADMISSION:  08/29/2019 ADMITTING PHYSICIAN: Loletha Grayer, MD  DATE OF DISCHARGE: 08/31/2019 10:55 AM  PRIMARY CARE PHYSICIAN: Delsa Grana, PA-C    ADMISSION DIAGNOSIS:  Colitis [K52.9] Acute GI bleeding [K92.2] Lower GI bleed [K92.2] Colitis with rectal bleeding [K52.9, K62.5] GI bleed [K92.2]  DISCHARGE DIAGNOSIS:  Principal Problem:   Colitis Active Problems:   Essential hypertension, benign   Sigmoid diverticulosis   Major depressive disorder, recurrent episode, moderate (HCC)   Lower GI bleed   Left lower quadrant abdominal pain   SECONDARY DIAGNOSIS:   Past Medical History:  Diagnosis Date   Angular cheilitis    Anxiety    Cardiac murmur 03/05/2017   Carotid atherosclerosis    Carotid bruit    CKD (chronic kidney disease) stage 2, GFR 60-89 ml/min    Depression    Elevated blood pressure    Frequency    Grade I diastolic dysfunction 1/60/7371   Echo Jan 2019   Gross hematuria    Hyperlipidemia    Hypertension    Nocturia    Pyuria    Sciatica    Sigmoid diverticulosis Oct. 2015   CT scan   Vitamin B12 deficiency    Vitamin D deficiency disease     HOSPITAL COURSE:   1.  Abdominal pain with lower GI bleed with bright red blood per rectum.  Patient still having slight lower abdominal pain.  CT scan was consistent with colitis.  Stool for C. difficile negative.  Stool comprehensive panel still pending.  Patient did not have any further bleeding since yesterday.  Hemoglobin did drift down to 12.0.  Appreciate GI consultation.  They recommend outpatient colonoscopy in about 3 weeks.  Aspirin held with bleeding.  Could be ischemic colitis versus infectious colitis versus diverticular bleed.  Only a few pills of oxycodone prescribed upon disposition.  Patient tolerated diet prior to  disposition. 2.  Essential hypertension.  Holding antihypertensive medications 3.  Hyperlipidemia on Crestor and Zetia 4.  Patient has episodes of sweating, flushing headache and feeling like she is going to pass out.  24-hour urine for metanephrines and 5 HIAA sent off to the lab but still pending.  DISCHARGE CONDITIONS:   Satisfactory  CONSULTS OBTAINED:  Treatment Team:  [name], MD  DRUG ALLERGIES:  No Known Allergies  DISCHARGE MEDICATIONS:   Allergies as of 08/31/2019   No Known Allergies     Medication List    STOP taking these medications   aspirin EC 81 MG tablet   lisinopril 10 MG tablet Commonly known as: ZESTRIL     TAKE these medications   CALCIUM PO Take 500 mg by mouth daily.   ezetimibe 10 MG tablet Commonly known as: ZETIA Take 1 tablet (10 mg total) by mouth daily.   famotidine 20 MG tablet Commonly known as: PEPCID Take 1 tablet (20 mg total) by mouth daily. Start taking on: September 01, 2019   oxyCODONE 5 MG immediate release tablet Commonly known as: Oxy IR/ROXICODONE Take 1 tablet (5 mg total) by mouth every 6 (six) hours as needed for severe pain.   polyethylene glycol 17 g packet Commonly known as: MIRALAX / GLYCOLAX Take 17 g by mouth daily as needed for moderate constipation.   rosuvastatin 40 MG tablet Commonly known as: CRESTOR Take 1 tablet (40 mg total) by mouth  daily.   vitamin B-12 1000 MCG tablet Commonly known as: CYANOCOBALAMIN Take 1 tablet by mouth daily.   Vitamin D (Ergocalciferol) 1.25 MG (50000 UNIT) Caps capsule Commonly known as: DRISDOL Take 1 capsule (50,000 Units total) by mouth every 7 (seven) days. x12 weeks.   Vitamin D 50 MCG (2000 UT) Caps Take 2,000 Units by mouth daily.        DISCHARGE INSTRUCTIONS:   Follow-up PMD 5 days Follow-up Dr. Vicente Males gastroenterology in 2 weeks to set up colonoscopy in 3 weeks  If you experience worsening of your admission symptoms, develop shortness of breath, life  threatening emergency, suicidal or homicidal thoughts you must seek medical attention immediately by calling 911 or calling your MD immediately  if symptoms less severe.  You Must read complete instructions/literature along with all the possible adverse reactions/side effects for all the Medicines you take and that have been prescribed to you. Take any new Medicines after you have completely understood and accept all the possible adverse reactions/side effects.   Please note  You were cared for by a hospitalist during your hospital stay. If you have any questions about your discharge medications or the care you received while you were in the hospital after you are discharged, you can call the unit and asked to speak with the hospitalist on call if the hospitalist that took care of you is not available. Once you are discharged, your primary care physician will handle any further medical issues. Please note that NO REFILLS for any discharge medications will be authorized once you are discharged, as it is imperative that you return to your primary care physician (or establish a relationship with a primary care physician if you do not have one) for your aftercare needs so that they can reassess your need for medications and monitor your lab values.    Today   CHIEF COMPLAINT:   Chief Complaint  Patient presents with   Rectal Bleeding    HISTORY OF PRESENT ILLNESS:  Sarah Gillespie  is a 69 y.o. female initially had constipation and then diarrhea then bleeding   VITAL SIGNS:  Blood pressure 130/74, pulse 75, temperature 97.9 F (36.6 C), temperature source Oral, resp. rate 20, height 4\' 11"  (1.499 m), weight 69.4 kg, SpO2 98 %.  PHYSICAL EXAMINATION:  GENERAL:  69 y.o.-year-old patient lying in the bed with no acute distress.  EYES: Pupils equal, round, reactive to light and accommodation. No scleral icterus. HEENT: Head atraumatic, normocephalic. Oropharynx and nasopharynx clear.  LUNGS:  Normal breath sounds bilaterally, no wheezing, rales,rhonchi or crepitation. No use of accessory muscles of respiration.  CARDIOVASCULAR: S1, S2 normal. No murmurs, rubs, or gallops.  ABDOMEN: Soft, slight lower abdominal tenderness, non-distended.  EXTREMITIES: No pedal edema, cyanosis, or clubbing.  NEUROLOGIC: Cranial nerves II through XII are intact. PSYCHIATRIC: The patient is alert and oriented x 3.  SKIN: No obvious rash, lesion, or ulcer.   DATA REVIEW:   CBC Recent Labs  Lab 08/31/19 0703  WBC 9.7  HGB 12.0  HCT 35.2*  PLT 218    Chemistries  Recent Labs  Lab 08/29/19 0201 08/30/19 0757 08/31/19 0703  NA 136   < > 139  K 4.2   < > 3.8  CL 103   < > 109  CO2 21*   < > 24  GLUCOSE 191*   < > 99  BUN 21   < > 10  CREATININE 1.01*   < > 0.98  CALCIUM 9.7   < >  8.2*  AST 31  --   --   ALT 22  --   --   ALKPHOS 88  --   --   BILITOT 0.6  --   --    < > = values in this interval not displayed.    Microbiology Results  Results for orders placed or performed during the hospital encounter of 08/29/19  SARS Coronavirus 2 by RT PCR (hospital order, performed in [facility] hospital lab) Nasopharyngeal Nasopharyngeal Swab     Status: None   Collection Time: 08/29/19  7:32 AM   Specimen: Nasopharyngeal Swab  Result Value Ref Range Status   SARS Coronavirus 2 NEGATIVE NEGATIVE Final    Comment: (NOTE) SARS-CoV-2 target nucleic acids are NOT DETECTED.  The SARS-CoV-2 RNA is generally detectable in upper and lower respiratory specimens during the acute phase of infection. The lowest concentration of SARS-CoV-2 viral copies this assay can detect is 250 copies / mL. A negative result does not preclude SARS-CoV-2 infection and should not be used as the sole basis for treatment or other patient management decisions.  A negative result may occur with improper specimen collection / handling, submission of specimen other than nasopharyngeal swab, presence of viral  mutation(s) within the areas targeted by this assay, and inadequate number of viral copies (<250 copies / mL). A negative result must be combined with clinical observations, patient history, and epidemiological information.  Fact Sheet for Patients:   StrictlyIdeas.no  Fact Sheet for Healthcare Providers: BankingDealers.co.za  This test is not yet approved or  cleared by the [place] FDA and has been authorized for detection and/or diagnosis of SARS-CoV-2 by FDA under an Emergency Use Authorization (EUA).  This EUA will remain in effect (meaning this test can be used) for the duration of the COVID-19 declaration under Section 564(b)(1) of the Act, 21 U.S.C. section 360bbb-3(b)(1), unless the authorization is terminated or revoked sooner.  Performed at [facility], [place], [place] [address]   C Difficile Quick Screen w PCR reflex     Status: None   Collection Time: 08/30/19  7:28 AM   Specimen: STOOL  Result Value Ref Range Status   C Diff antigen NEGATIVE NEGATIVE Final   C Diff toxin NEGATIVE NEGATIVE Final   C Diff interpretation No C. difficile detected.  Final    Comment: Performed at [facility], [address] [place], [place] [address]     Management plans discussed with the patient, family and they are in agreement.  CODE STATUS:     Code Status Orders  (From admission, onward)         Start     Ordered   08/29/19 0815  Full code  Continuous        08/29/19 0817        Code Status History    Date Active Date Inactive Code Status Order ID Comments User Context   04/05/2017 1156 08/29/2019 0152 DNR [id number]  Arnetha Courser, MD Outpatient   Advance Care Planning Activity      TOTAL TIME TAKING CARE OF THIS PATIENT: 34 minutes.    Loletha Grayer M.D on 08/31/2019 at 2:30 PM  Between 7am to 6pm - Pager - [phone]  After 6pm go to [email or web address] - password  [email or web address]  [facility]  CC: Primary care physician; Delsa Grana, PA-C

## 2019-08-31 NOTE — Plan of Care (Signed)
  Problem: Education: Goal: Knowledge of General Education information will improve Description Including pain rating scale, medication(s)/side effects and non-pharmacologic comfort measures Outcome: Progressing   

## 2019-08-31 NOTE — Progress Notes (Signed)
24hr urine collection was sent to Lab at 10am. DC paper was given and explained to pt. Pt left the floor via wheelchair by the NT. Daughter picked the pt to home.

## 2019-08-31 NOTE — Progress Notes (Signed)
Sarah Gillespie , MD [address] [place], [place], [place], [address] [address], [place], [place], [place], [address] Phone: [phone]  Fax: [phone]   [name] is being followed for ischemic colitis  Day 2 of follow up   Subjective:   Doing well , no bowel movements. All dressed to go home. Mild lower abdominal discomfort but overall feeling much better    Objective: Vital signs in last 24 hours: Vitals:   08/30/19 1302 08/30/19 1653 08/30/19 2307 08/31/19 0725  BP: 133/62 140/77 123/73 130/74  Pulse: 87 92 78 75  Resp: 17 17 17 20   Temp: 99 F (37.2 C) 99.3 F (37.4 C) 98.4 F (36.9 C) 97.9 F (36.6 C)  TempSrc: Oral Oral Oral Oral  SpO2: 97% 97% 99% 98%  Weight:      Height:       Weight change:   Intake/Output Summary (Last 24 hours) at 08/31/2019 0912 Last data filed at 08/31/2019 0400 Gross per 24 hour  Intake 1068.42 ml  Output 1300 ml  Net -231.58 ml     Exam: Heart:: Regular rate and rhythm, S1S2 present or without murmur or extra heart sounds Lungs: normal, clear to auscultation and clear to auscultation and percussion Abdomen: soft, nontender, normal bowel sounds   Lab Results: @LABTEST2 @ Micro Results: Recent Results (from the past 240 hour(s))  SARS Coronavirus 2 by RT PCR (hospital order, performed in [facility] hospital lab) Nasopharyngeal Nasopharyngeal Swab     Status: None   Collection Time: 08/29/19  7:32 AM   Specimen: Nasopharyngeal Swab  Result Value Ref Range Status   SARS Coronavirus 2 NEGATIVE NEGATIVE Final    Comment: (NOTE) SARS-CoV-2 target nucleic acids are NOT DETECTED.  The SARS-CoV-2 RNA is generally detectable in upper and lower respiratory specimens during the acute phase of infection. The lowest concentration of SARS-CoV-2 viral copies this assay can detect is 250 copies / mL. A negative result does not preclude SARS-CoV-2 infection and should not be used as the sole basis for treatment or  other patient management decisions.  A negative result may occur with improper specimen collection / handling, submission of specimen other than nasopharyngeal swab, presence of viral mutation(s) within the areas targeted by this assay, and inadequate number of viral copies (<250 copies / mL). A negative result must be combined with clinical observations, patient history, and epidemiological information.  Fact Sheet for Patients:   StrictlyIdeas.no  Fact Sheet for Healthcare Providers: BankingDealers.co.za  This test is not yet approved or  cleared by the [place] FDA and has been authorized for detection and/or diagnosis of SARS-CoV-2 by FDA under an Emergency Use Authorization (EUA).  This EUA will remain in effect (meaning this test can be used) for the duration of the COVID-19 declaration under Section 564(b)(1) of the Act, 21 U.S.C. section 360bbb-3(b)(1), unless the authorization is terminated or revoked sooner.  Performed at [facility], [place], [place] [address]   C Difficile Quick Screen w PCR reflex     Status: None   Collection Time: 08/30/19  7:28 AM   Specimen: STOOL  Result Value Ref Range Status   C Diff antigen NEGATIVE NEGATIVE Final   C Diff toxin NEGATIVE NEGATIVE Final   C Diff interpretation No C. difficile detected.  Final    Comment: Performed at [facility], [address] [place], [place] [address]   Studies/Results: No results found. Medications: I have reviewed the patient's current  medications. Scheduled Meds:  ezetimibe  10 mg Oral Daily   famotidine  20 mg Oral Daily   rosuvastatin  40 mg Oral Daily   sodium chloride flush  3 mL Intravenous Q12H   Continuous Infusions:  sodium chloride 40 mL/hr at 08/30/19 1400   PRN Meds:.hydrALAZINE, morphine injection, ondansetron **OR** ondansetron (ZOFRAN) IV, oxyCODONE, polyethylene  glycol   Assessment: Principal Problem:   Colitis Active Problems:   Essential hypertension, benign   Sigmoid diverticulosis   Major depressive disorder, recurrent episode, moderate (HCC)   Lower GI bleed   Left lower quadrant abdominal pain  Sarah Gillespie 69 y.o. female  presented with  Rectal bleeding and imaging demonstrated left sided colitis- differentials are ischemic colitis vs ischemic colitis Usually ischemic colitis occurs due to an episode of hypoperfusion/hypotension. Mostly recovers with conservative management with fluids  . Her history is suggestive of a possible diverticular bleed with secondary hypotension and then subsequent ischemic colitis. Sister has had colon cancer but she has never had a colonoscopy.Improved since yesterday   Plan: 1.  Outpatient colonoscopy    LOS: 1 day   Sarah Bellows, MD 08/31/2019, 9:12 AM

## 2019-09-01 ENCOUNTER — Telehealth: Payer: Self-pay | Admitting: Family Medicine

## 2019-09-01 ENCOUNTER — Telehealth: Payer: Self-pay

## 2019-09-01 NOTE — Telephone Encounter (Signed)
Pt is scheduled for Friday 09/05/2019

## 2019-09-01 NOTE — Telephone Encounter (Signed)
Patient is calling to schedule Hospital follow Up apt. Patient was discharged on 08/31/19. First available Hospital follow up apt is the end of July. Can the patient be seen sooner. Please advise Cb- [phone]

## 2019-09-01 NOTE — Telephone Encounter (Signed)
Transition Care Management Follow-up Telephone Call  Date of discharge and from where: 08/31/19 [facility]  How have you been since you were released from the hospital? Pt states she is doing okay, pain 5/10 in lower abdomen; appetite improving & following diet guidelines.   Any questions or concerns? No  pt has had a couple of small soft bowel movements that did have a couple of spots of blood but pt states GI told her to expect that for now.   Items Reviewed:  Did the pt receive and understand the discharge instructions provided? Yes   Medications obtained and verified? Yes   Any new allergies since your discharge? No   Dietary orders reviewed? Yes  Do you have support at home? Yes   Functional Questionnaire: (I = Independent and D = Dependent) ADLs: I  Bathing/Dressing- I  Meal Prep- I  Eating- I  Maintaining continence- I  Transferring/Ambulation- I  Managing Meds- I  Follow up appointments reviewed:   PCP Hospital f/u appt confirmed? Yes  Scheduled to see Delsa Grana PAC on 09/05/19.  [facility] f/u appt confirmed? No  Pt to call gastroenterology to schedule follow up and colonoscopy.   Are transportation arrangements needed? No   If their condition worsens, is the pt aware to call PCP or go to the Emergency Dept.? Yes  Was the patient provided with contact information for the PCP's office or ED? Yes  Was to pt encouraged to call back with questions or concerns? Yes

## 2019-09-05 ENCOUNTER — Encounter: Payer: Self-pay | Admitting: Family Medicine

## 2019-09-05 ENCOUNTER — Other Ambulatory Visit: Payer: Self-pay

## 2019-09-05 ENCOUNTER — Ambulatory Visit (INDEPENDENT_AMBULATORY_CARE_PROVIDER_SITE_OTHER): Payer: Medicare HMO | Admitting: Family Medicine

## 2019-09-05 VITALS — BP 136/90 | HR 86 | Temp 96.8°F | Resp 14 | Ht 59.0 in | Wt 154.0 lb

## 2019-09-05 DIAGNOSIS — R1030 Lower abdominal pain, unspecified: Secondary | ICD-10-CM | POA: Diagnosis not present

## 2019-09-05 DIAGNOSIS — K529 Noninfective gastroenteritis and colitis, unspecified: Secondary | ICD-10-CM | POA: Diagnosis not present

## 2019-09-05 DIAGNOSIS — I10 Essential (primary) hypertension: Secondary | ICD-10-CM | POA: Diagnosis not present

## 2019-09-05 DIAGNOSIS — Z09 Encounter for follow-up examination after completed treatment for conditions other than malignant neoplasm: Secondary | ICD-10-CM | POA: Diagnosis not present

## 2019-09-05 DIAGNOSIS — K922 Gastrointestinal hemorrhage, unspecified: Secondary | ICD-10-CM | POA: Diagnosis not present

## 2019-09-05 DIAGNOSIS — K5730 Diverticulosis of large intestine without perforation or abscess without bleeding: Secondary | ICD-10-CM

## 2019-09-05 MED ORDER — LISINOPRIL 10 MG PO TABS: 10.0000 mg | ORAL_TABLET | Freq: Every day | ORAL | 3 refills | Status: DC

## 2019-09-05 NOTE — Progress Notes (Signed)
Name: Sarah Gillespie   MRN: [id number]    DOB: 1951/01/29   Date:09/05/2019       Progress Note  Chief Complaint  Patient presents with  . Hospitalization Follow-up    Gi bleed, dx with colitis     Subjective:   Sarah Gillespie is a 69 y.o. female, presents to clinic for   Here for hospital follow up/transition of care.  Admit date: 08/29/2019 Discharge date: 08/31/2019 Transition of care was initiated previously by Clemetine Marker on 09/01/2019 and med changes, diagnosis, specialist follow ups and pts symptoms and condition were all reviewed.  Pt was admitted for colitis, Acute lower GI bleed Dx with sigmoid diverticulosis and lower GI bleed Hb decreased slightly to 12.0, Gi did consult in hospital and they advised outpt colonoscopy in 3 weeks - Treatment Team:  [name], MD ASA held due to bleeding HTN - meds were held during hospitalization? And told to d/c at discharge BP a little higher today than prior OV, in hospital there was no hypotension and SBP ranged 130-160 BP Readings from Last 3 Encounters:  09/05/19 136/90  08/31/19 130/74  08/08/19 126/78   Continue crestor and zetia  New medications started per hospitalization include pepcid and miralax, she only took a few pain pills Labs due today are - none, reviewed in hospital labs, H/H trend and discharge summary, GI consult notes  Pt feels so much better, she went walking and had a normal bowel movement and she felt much better after that. She currently has no abd pain, she has not had any melena or hematochezia.  She is taking MiraLAX as instructed and states that she having normal soft bowel movements no pain and no straining.   Dr. Georgeann Oppenheim consult note: Sarah Gillespie is a 69 y.o. y/o female presented with  Rectal bleeding and imaging demonstrated left sided colitis- differentials are ischemic colitis vs ischemic colitis Usually ischemic colitis occurs due to an episode of hypoperfusion/hypotension. Mostly recovers  with conservative management with fluids  . Her history is suggestive of a possible diverticular bleed with secondary hypotension and then subsequent ischemic colitis. Sister has had colon cancer but she has never had a colonoscopy.    Plan  1. Conservative therapy with IV fluid hydration and avoiding hypotensive episodes.  2. Serial abdominal exams and if pain or examination gets worse suggest CT angiogram to r/o mesenteric ischemia  3. Follow up stool studies for GI PCR and C diff to r/o possible infectious colitis  4. Outpatient colonoscopy  5. IF no better in 48 hours then will need flexible sigmoidoscopy     Current Outpatient Medications:  .  CALCIUM PO, Take 500 mg by mouth daily. , Disp: , Rfl:  .  Cholecalciferol (VITAMIN D) 2000 UNITS CAPS, Take 2,000 Units by mouth daily., Disp: , Rfl:  .  ezetimibe (ZETIA) 10 MG tablet, Take 1 tablet (10 mg total) by mouth daily., Disp: 90 tablet, Rfl: 3 .  famotidine (PEPCID) 20 MG tablet, Take 1 tablet (20 mg total) by mouth daily., Disp: 30 tablet, Rfl: 0 .  oxyCODONE (OXY IR/ROXICODONE) 5 MG immediate release tablet, Take 1 tablet (5 mg total) by mouth every 6 (six) hours as needed for severe pain., Disp: 9 tablet, Rfl: 0 .  polyethylene glycol (MIRALAX / GLYCOLAX) 17 g packet, Take 17 g by mouth daily as needed for moderate constipation., Disp: 14 each, Rfl: 0 .  rosuvastatin (CRESTOR) 40 MG tablet, Take 1 tablet (40 mg total) by  mouth daily., Disp: 90 tablet, Rfl: 3 .  vitamin B-12 (CYANOCOBALAMIN) 1000 MCG tablet, Take 1 tablet by mouth daily., Disp: , Rfl:  .  Vitamin D, Ergocalciferol, (DRISDOL) 1.25 MG (50000 UNIT) CAPS capsule, Take 1 capsule (50,000 Units total) by mouth every 7 (seven) days. x12 weeks., Disp: 12 capsule, Rfl: 0  Patient Active Problem List   Diagnosis Date Noted  . Lower GI bleed 08/30/2019  . Left lower quadrant abdominal pain   . Colitis 08/29/2019  . Class 1 obesity with serious comorbidity and body mass  index (BMI) of 32.0 to 32.9 in adult 02/07/2019  . Mild concentric left ventricular hypertrophy (LVH) 03/20/2017  . CKD (chronic kidney disease) stage 2, GFR 60-89 ml/min 11/02/2016  . Knee sprain and strain 11/08/2015  . Sciatica 10/08/2015  . Postmenopausal 12/16/2014  . Trochanteric bursitis of both hips 12/07/2014  . Hyperlipidemia   . Vitamin B12 deficiency   . Vitamin D deficiency disease   . Carotid atherosclerosis   . Essential hypertension, benign   . Major depressive disorder, recurrent episode, moderate ([place]) 12/10/2013  . Sigmoid diverticulosis 12/04/2013    Past Surgical History:  Procedure Laterality Date  . ABDOMINAL HYSTERECTOMY  1987  . APPENDECTOMY  1960  . CARPAL TUNNEL RELEASE Left 1988  . EXCISION NEUROMA Left 1988  . TONSILLECTOMY  1962    Family History  Problem Relation Age of Onset  . Hypertension Mother   . Heart disease Mother        CHF for months, aortic stenosis for years  . Clotting disorder Mother   . Aortic stenosis Mother   . Heart failure Mother   . Depression Mother   . Deep vein thrombosis Mother   . Other Sister        muscle disease, polymyositis  . Heart failure Sister   . Pneumonia Sister        cause of death  . ALS Father   . Urolithiasis Father   . Thyroid disease Father   . CVA Father   . Thyroid disease Sister   . Clotting disorder Sister   . Hyperlipidemia Sister   . Migraines Sister   . Deep vein thrombosis Sister   . Cancer Brother 50       lung, brain cancer  . Diabetes Maternal Grandmother   . Stroke Brother   . Heart murmur Brother        rheumatic fever    Social History   Tobacco Use  . Smoking status: Never Smoker  . Smokeless tobacco: Never Used  Vaping Use  . Vaping Use: Never used  Substance Use Topics  . Alcohol use: Yes    Comment: occasional wine  . Drug use: No     No Known Allergies  Health Maintenance  Topic Date Due  . MAMMOGRAM  Never done  . Fecal DNA (Cologuard)  Never done    . DEXA SCAN  Never done  . TETANUS/TDAP  08/07/2020 (Originally 09/03/1967)  . INFLUENZA VACCINE  10/05/2019  . COVID-19 Vaccine  Completed  . Hepatitis C Screening  Completed  . PNA vac Low Risk Adult  Completed    Chart Review Today: I personally reviewed active problem list, medication list, allergies, family history, social history, health maintenance, notes from last encounter, lab results, imaging with the patient/caregiver today.   Review of Systems  Constitutional: Negative.  Negative for activity change, appetite change, chills, diaphoresis, fatigue, fever and unexpected weight change.  HENT: Negative.  Eyes: Negative.   Respiratory: Negative.   Cardiovascular: Negative.  Negative for chest pain, palpitations and leg swelling.  Gastrointestinal: Negative.  Negative for abdominal distention, abdominal pain, blood in stool, diarrhea, nausea, rectal pain and vomiting.  Endocrine: Negative.   Genitourinary: Negative.   Musculoskeletal: Negative.   Skin: Negative.   Allergic/Immunologic: Negative.   Neurological: Negative.   Hematological: Negative.   Psychiatric/Behavioral: Negative.   All other systems reviewed and are negative.       Objective:   Vitals:   09/05/19 0910  BP: 136/90  Pulse: 86  Resp: 14  Temp: (!) 96.8 F (36 C)  SpO2: 95%  Weight: 154 lb (69.9 kg)  Height: 4\' 11"  (1.499 m)    Body mass index is 31.1 kg/m.  Physical Exam Vitals and nursing note reviewed.  Constitutional:      General: She is not in acute distress.    Appearance: Normal appearance. She is well-developed. She is obese. She is not ill-appearing, toxic-appearing or diaphoretic.     Interventions: Face mask in place.  HENT:     Head: Normocephalic and atraumatic.     Right Ear: External ear normal.     Left Ear: External ear normal.  Eyes:     General: Lids are normal. No scleral icterus.       Right eye: No discharge.        Left eye: No discharge.      Conjunctiva/sclera: Conjunctivae normal.  Neck:     Trachea: Phonation normal. No tracheal deviation.  Cardiovascular:     Rate and Rhythm: Normal rate and regular rhythm.     Pulses: Normal pulses.          Radial pulses are 2+ on the right side and 2+ on the left side.       Posterior tibial pulses are 2+ on the right side and 2+ on the left side.     Heart sounds: Normal heart sounds. No murmur heard.  No friction rub. No gallop.   Pulmonary:     Effort: Pulmonary effort is normal. No respiratory distress.     Breath sounds: Normal breath sounds. No stridor. No wheezing, rhonchi or rales.  Chest:     Chest wall: No tenderness.  Abdominal:     General: Bowel sounds are normal. There is no distension.     Palpations: Abdomen is soft.     Tenderness: There is no abdominal tenderness. There is no guarding or rebound.     Comments: Soft obese abdomen normal bowel sounds x4, no tenderness to palpation, no CVA tenderness bilaterally  Musculoskeletal:     Right lower leg: No edema.     Left lower leg: No edema.  Skin:    General: Skin is warm and dry.     Coloration: Skin is not jaundiced or pale.     Findings: No rash.  Neurological:     Mental Status: She is alert. Mental status is at baseline.     Motor: No abnormal muscle tone.     Gait: Gait normal.  Psychiatric:        Mood and Affect: Mood normal.        Speech: Speech normal.        Behavior: Behavior normal.         Assessment & Plan:     ICD-10-CM   1. Lower GI bleed  K92.2 Ambulatory referral to Gastroenterology   Continue to hold aspirin, no recurrent bleeding symptoms  2. Lower abdominal pain  R10.30 Ambulatory referral to Gastroenterology   Improved abdominal pain nontender on exam today continue Pepcid and MiraLAX  3. Colitis  K52.9 Ambulatory referral to Gastroenterology   Improving, patient to follow-up with GI  4. Sigmoid diverticulosis  K57.30 Ambulatory referral to Gastroenterology   Follow-up GI  5.  Encounter for examination following treatment at hospital  Z09    All records reviewed, med changes reviewed and adjusted today, improving  6. Essential hypertension, benign  I10    Patient advised to restart lisinopril 10 mg if blood pressure is over 140/80, she will monitor at home, meds available, follow-up in 6 weeks     Return for 6 week virtual to review BP readings,   Need routine f/up scheduled early Dec.   Delsa Grana, PA-C 09/05/19 9:23 AM

## 2019-09-05 NOTE — Patient Instructions (Signed)
If your blood pressure is over 140/90 then restart your lisinopril and keep monitoring your blood pressure.

## 2019-09-09 LAB — METANEPHRINES, URINE, 24 HOUR
Metaneph Total, Ur: 70 ug/L
Metanephrines, 24H Ur: 81 ug/24 hr (ref 36–209)
Normetanephrine, 24H Ur: 322 ug/24 hr (ref 131–612)
Normetanephrine, Ur: 280 ug/L
Total Volume: 1150

## 2019-09-09 NOTE — Telephone Encounter (Unsigned)
Copied from [place] [id number]. Topic: General - Other >> Sep 09, 2019  1:17 PM Leward Quan A wrote: Reason for CRM: Patient called to inform Delsa Grana that she want to be referred to Dr [name], MD for her Gasteroenterology treatment since that was who she met in the hospital and he already know all about her and her issues. Dr [name], MD [address], [place], [place] [address]  Ph# [phone] patient can be reached at Ph# [phone]

## 2019-09-10 LAB — MISC LABCORP TEST (SEND OUT): Labcorp test code: 4069

## 2019-10-17 ENCOUNTER — Encounter: Payer: Self-pay | Admitting: Family Medicine

## 2019-10-17 ENCOUNTER — Telehealth (INDEPENDENT_AMBULATORY_CARE_PROVIDER_SITE_OTHER): Payer: Medicare HMO | Admitting: Family Medicine

## 2019-10-17 ENCOUNTER — Other Ambulatory Visit: Payer: Self-pay

## 2019-10-17 VITALS — BP 128/73 | HR 71 | Ht 59.0 in | Wt 148.0 lb

## 2019-10-17 DIAGNOSIS — Z789 Other specified health status: Secondary | ICD-10-CM

## 2019-10-17 DIAGNOSIS — Z7189 Other specified counseling: Secondary | ICD-10-CM | POA: Diagnosis not present

## 2019-10-17 DIAGNOSIS — I10 Essential (primary) hypertension: Secondary | ICD-10-CM | POA: Diagnosis not present

## 2019-10-17 NOTE — Progress Notes (Signed)
Name: Sarah Gillespie   MRN: [id number]    DOB: [birth year]   Date:10/17/2019       Progress Note  Subjective:    Chief Complaint  Chief Complaint  Patient presents with  . Follow-up  . Hypertension    on avg 120's/70's    I connected with  Sarah Gillespie on 10/17/19 at  3:00 PM EDT by telephone and verified that I am speaking with the correct person using two identifiers.  I discussed the limitations, risks, security and privacy concerns of performing an evaluation and management service by telephone and the availability of in person appointments. Staff also discussed with the patient that there may be a patient responsible charge related to this service. Patient Location: home Provider Location: [facility] clinic  Additional Individuals present: none  HPI    Virtual appt for BP recheck - pt was taken off lisinopril BP at home, she monitored her BP at home, it has stayed 120's and she never restarted lisinopril.  She feels good.    She has continued to take her crestor - no SE or concerns.  We previously discussed prior DNR status that was on her chart, and she did want to confirm today that she wants to be full code - she feels that if something happened to her she would want life saving measures and life support.  She does not currently have advanced directive, living will or power of attorney done - will mail to her  She is planning on moving to the [place] next month with her daughter, she will be looking for a new PCP there in [place].   All meds are filled and at [facility] - she is able to transfer prescriptions  Patient Active Problem List   Diagnosis Date Noted  . Lower GI bleed 08/30/2019  . Left lower quadrant abdominal pain   . Colitis 08/29/2019  . Class 1 obesity with serious comorbidity and body mass index (BMI) of 32.0 to 32.9 in adult 02/07/2019  . Mild concentric left ventricular hypertrophy (LVH) 03/20/2017  . CKD (chronic kidney disease) stage 2, GFR 60-89 ml/min  11/02/2016  . Knee sprain and strain 11/08/2015  . Sciatica 10/08/2015  . Postmenopausal 12/16/2014  . Trochanteric bursitis of both hips 12/07/2014  . Hyperlipidemia   . Vitamin B12 deficiency   . Vitamin D deficiency disease   . Carotid atherosclerosis   . Essential hypertension, benign   . Major depressive disorder, recurrent episode, moderate ([place]) 12/10/2013  . Sigmoid diverticulosis 12/04/2013    Current Outpatient Medications:  .  CALCIUM PO, Take 500 mg by mouth daily. , Disp: , Rfl:  .  ezetimibe (ZETIA) 10 MG tablet, Take 1 tablet (10 mg total) by mouth daily., Disp: 90 tablet, Rfl: 3 .  polyethylene glycol (MIRALAX / GLYCOLAX) 17 g packet, Take 17 g by mouth daily as needed for moderate constipation., Disp: 14 each, Rfl: 0 .  rosuvastatin (CRESTOR) 40 MG tablet, Take 1 tablet (40 mg total) by mouth daily., Disp: 90 tablet, Rfl: 3 .  vitamin B-12 (CYANOCOBALAMIN) 1000 MCG tablet, Take 1 tablet by mouth daily., Disp: , Rfl:  .  Vitamin D, Ergocalciferol, (DRISDOL) 1.25 MG (50000 UNIT) CAPS capsule, Take 1 capsule (50,000 Units total) by mouth every 7 (seven) days. x12 weeks., Disp: 12 capsule, Rfl: 0 .  Cholecalciferol (VITAMIN D) 2000 UNITS CAPS, Take 2,000 Units by mouth daily. (Patient not taking: Reported on 10/17/2019), Disp: , Rfl:  .  lisinopril (ZESTRIL) 10  MG tablet, Take 1 tablet (10 mg total) by mouth daily. For blood pressure (Patient not taking: Reported on 10/17/2019), Disp: 90 tablet, Rfl: 3 No Known Allergies  Past Surgical History:  Procedure Laterality Date  . ABDOMINAL HYSTERECTOMY  1987  . APPENDECTOMY  1960  . CARPAL TUNNEL RELEASE Left 1988  . EXCISION NEUROMA Left 1988  . TONSILLECTOMY  1962   Family History  Problem Relation Age of Onset  . Hypertension Mother   . Heart disease Mother        CHF for months, aortic stenosis for years  . Clotting disorder Mother   . Aortic stenosis Mother   . Heart failure Mother   . Depression Mother   . Deep  vein thrombosis Mother   . Other Sister        muscle disease, polymyositis  . Heart failure Sister   . Pneumonia Sister        cause of death  . ALS Father   . Urolithiasis Father   . Thyroid disease Father   . CVA Father   . Thyroid disease Sister   . Clotting disorder Sister   . Hyperlipidemia Sister   . Migraines Sister   . Deep vein thrombosis Sister   . Cancer Brother 50       lung, brain cancer  . Diabetes Maternal Grandmother   . Stroke Brother   . Heart murmur Brother        rheumatic fever   Social History   Socioeconomic History  . Marital status: Divorced    Spouse name: Not on file  . Number of children: 2  . Years of education: Not on file  . Highest education level: Some college, no degree  Occupational History  . Occupation: retired  Tobacco Use  . Smoking status: Never Smoker  . Smokeless tobacco: Never Used  Vaping Use  . Vaping Use: Never used  Substance and Sexual Activity  . Alcohol use: Yes    Comment: occasional wine  . Drug use: No  . Sexual activity: Not Currently    Partners: Male  Other Topics Concern  . Not on file  Social History Narrative  . Not on file   Social Determinants of Health   Financial Resource Strain: Low Risk   . Difficulty of Paying Living Expenses: Not hard at all  Food Insecurity: No Food Insecurity  . Worried About Charity fundraiser in the Last Year: Never true  . Ran Out of Food in the Last Year: Never true  Transportation Needs: No Transportation Needs  . Lack of Transportation (Medical): No  . Lack of Transportation (Non-Medical): No  Physical Activity: Inactive  . Days of Exercise per Week: 0 days  . Minutes of Exercise per Session: 0 min  Stress: No Stress Concern Present  . Feeling of Stress : Not at all  Social Connections: Socially Isolated  . Frequency of Communication with Friends and Family: More than three times a week  . Frequency of Social Gatherings with Friends and Family: Three times a  week  . Attends Religious Services: Never  . Active Member of Clubs or Organizations: No  . Attends Archivist Meetings: Never  . Marital Status: Divorced  Human resources officer Violence: Not At Risk  . Fear of Current or Ex-Partner: No  . Emotionally Abused: No  . Physically Abused: No  . Sexually Abused: No    Chart Review Today: I personally reviewed active problem list, medication list, allergies,  family history, social history, health maintenance, notes from last encounter, lab results, imaging with the patient/caregiver today.  Review of Systems 10 Systems reviewed and are negative for acute change except as noted in the HPI.   Objective:    Virtual encounter, vitals limited, only able to obtain the following: Today's Vitals   10/17/19 1440  BP: 128/73  Pulse: 71  Weight: 148 lb (67.1 kg)  Height: 4\' 11"  (1.499 m)   Body mass index is 29.89 kg/m. Nursing Note and Vital Signs reviewed.  Physical Exam Vitals and nursing note reviewed.  Neurological:     Mental Status: She is alert.  Psychiatric:        Mood and Affect: Mood normal.        Behavior: Behavior normal.     PE limited by telephone encounter   PHQ2/9: Depression screen [facility] 2/9 10/17/2019 09/05/2019 08/08/2019 04/17/2019 02/07/2019  Decreased Interest 0 0 0 0 0  Down, Depressed, Hopeless 0 0 0 0 0  PHQ - 2 Score 0 0 0 0 0  Altered sleeping 0 0 0 - 0  Tired, decreased energy 0 0 0 - 0  Change in appetite 0 0 0 - 0  Feeling bad or failure about yourself  0 0 0 - 0  Trouble concentrating 0 0 0 - 0  Moving slowly or fidgety/restless 0 0 0 - 0  Suicidal thoughts 0 0 0 - 0  PHQ-9 Score 0 0 0 - 0  Difficult doing work/chores Not difficult at all Not difficult at all Not difficult at all - Not difficult at all  Some recent data might be hidden   PHQ-2/9 Result is neg, reviewed today  Fall Risk: Fall Risk  10/17/2019 09/05/2019 08/08/2019 04/17/2019 02/07/2019  Falls in the past year? 0 0 0 1 1  Number  falls in past yr: 0 0 0 1 1  Comment - - [place] -  Injury with Fall? 0 0 0 1 1  Comment - - - right foot injury; 2 fractured toes - no surgery -  Risk for fall due to : - - - No Fall Risks -  Follow up - - - Falls prevention discussed -     Assessment and Plan:     ICD-10-CM   1. Essential hypertension, benign  I10    stable, well controlled with only diet and lifestyle efforts - after GI issues changing diet and loosing some weight, no need to resume lisinopril - d/c  2. Patient is full code  Z78.9    discussed again today  3. Advanced care planning/counseling discussion  Z71.89    chart reflects pt's wishes to be full code, encouraged pt to complete advanced directives, living will, POA   Return in about 4 months (around 02/09/2020) for virtual f/up ok for HLD, HTN.   I provided 20 minutes of non-face-to-face time during this encounter.  Delsa Grana, PA-C 10/17/19 3:28 PM

## 2019-10-29 ENCOUNTER — Other Ambulatory Visit: Payer: Self-pay | Admitting: Family Medicine

## 2019-11-03 ENCOUNTER — Encounter: Payer: Self-pay | Admitting: Gastroenterology

## 2019-11-03 ENCOUNTER — Other Ambulatory Visit: Payer: Self-pay

## 2019-11-03 ENCOUNTER — Ambulatory Visit: Payer: Medicare HMO | Admitting: Gastroenterology

## 2019-11-03 VITALS — BP 156/85 | HR 83 | Temp 98.2°F | Ht 59.0 in | Wt 154.0 lb

## 2019-11-03 DIAGNOSIS — Z1211 Encounter for screening for malignant neoplasm of colon: Secondary | ICD-10-CM

## 2019-11-03 DIAGNOSIS — K922 Gastrointestinal hemorrhage, unspecified: Secondary | ICD-10-CM | POA: Diagnosis not present

## 2019-11-03 MED ORDER — NA SULFATE-K SULFATE-MG SULF 17.5-3.13-1.6 GM/177ML PO SOLN
1.0000 | Freq: Once | ORAL | 0 refills | Status: AC
Start: 2019-11-03 — End: 2019-11-03

## 2019-11-03 NOTE — Progress Notes (Signed)
[name] MD, MRCP(U.K) [address]  [place]  [place], [place] [address]  Main: [phone]  Fax: [phone]   Primary Care Physician: Delsa Grana, PA-C  Primary Gastroenterologist:  Dr. Harland German follow up  HPI: Sarah Gillespie is a 69 y.o. female    Summary of history :  She presented to the emergency room with rectal bleeding on 08/29/2019.  Never had a prior colonoscopy no NSAID usage.  CT scan of the abdomen pelvis demonstrated inflammation from the splenic flexure to the rectum.  Hemoglobin is 14.3 g.  Elevated serum lactate level.  My impression was that she had possible ischemic colitis versus inflammatory colitis.  Likely cause of ischemic colitis was possibly diverticular bleed.  Sister had colon cancer.  Stool for C. difficile was negative.  Plan was for an outpatient colonoscopy  Interval history 08/31/2019-11/03/2019  Since discharge from hospital she has been doing well.  No complaints.  No rectal bleeding.  Has a bowel movement daily.  Denies any constipation.  Denies any NSAID use. Current Outpatient Medications  Medication Sig Dispense Refill  . CALCIUM PO Take 500 mg by mouth daily.     . Cholecalciferol (VITAMIN D) 2000 UNITS CAPS Take 2,000 Units by mouth daily. (Patient not taking: Reported on 10/17/2019)    . ezetimibe (ZETIA) 10 MG tablet Take 1 tablet (10 mg total) by mouth daily. 90 tablet 3  . polyethylene glycol (MIRALAX / GLYCOLAX) 17 g packet Take 17 g by mouth daily as needed for moderate constipation. 14 each 0  . rosuvastatin (CRESTOR) 40 MG tablet Take 1 tablet (40 mg total) by mouth daily. 90 tablet 3  . vitamin B-12 (CYANOCOBALAMIN) 1000 MCG tablet Take 1 tablet by mouth daily.    . Vitamin D, Ergocalciferol, (DRISDOL) 1.25 MG (50000 UNIT) CAPS capsule Take 1 capsule (50,000 Units total) by mouth every 7 (seven) days. x12 weeks. 12 capsule 0   No current facility-administered medications for this visit.    Allergies as  of 11/03/2019  . (No Known Allergies)    ROS:  General: Negative for anorexia, weight loss, fever, chills, fatigue, weakness. ENT: Negative for hoarseness, difficulty swallowing , nasal congestion. CV: Negative for chest pain, angina, palpitations, dyspnea on exertion, peripheral edema.  Respiratory: Negative for dyspnea at rest, dyspnea on exertion, cough, sputum, wheezing.  GI: See history of present illness. GU:  Negative for dysuria, hematuria, urinary incontinence, urinary frequency, nocturnal urination.  Endo: Negative for unusual weight change.    Physical Examination:   There were no vitals taken for this visit.  General: Well-nourished, well-developed in no acute distress.  Eyes: No icterus. Conjunctivae pink. Mouth: Oropharyngeal mucosa moist and pink , no lesions erythema or exudate. Lungs: Clear to auscultation bilaterally. Non-labored. Heart: Regular rate and rhythm, no murmurs rubs or gallops.  Abdomen: Bowel sounds are normal, nontender, nondistended, no hepatosplenomegaly or masses, no abdominal bruits or hernia , no rebound or guarding.   Extremities: No lower extremity edema. No clubbing or deformities. Neuro: Alert and oriented x 3.  Grossly intact. Skin: Warm and dry, no jaundice.   Psych: Alert and cooperative, normal mood and affect.   Imaging Studies: No results found.  Assessment and Plan:   Sarah Gillespie is a 69 y.o. y/o female here to follow-up from hospital discharge when she was admitted with a lower GI bleed felt to be a diverticular bleed and left-sided colitis seen on CT scan suspected to be ischemic in nature  due to the diverticular bleed.  Never had a colonoscopy and sister had colon cancer.   Plan 1.  Check CBC. 2.  Schedule for diagnostic colonoscopy.  Family history of colon cancer in a sister   I have discussed alternative options, risks & benefits,  which include, but are not limited to, bleeding, infection, perforation,respiratory  complication & drug reaction.  The patient agrees with this plan & written consent will be obtained.     Dr [name]  MD,MRCP [facility]) Follow up in as  needed

## 2019-11-04 ENCOUNTER — Other Ambulatory Visit
Admission: RE | Admit: 2019-11-04 | Discharge: 2019-11-04 | Disposition: A | Discharge status: Home / Self Care | Payer: Medicare HMO | Source: Ambulatory Visit | Attending: Gastroenterology | Admitting: Gastroenterology

## 2019-11-04 ENCOUNTER — Encounter: Payer: Self-pay | Admitting: Gastroenterology

## 2019-11-04 DIAGNOSIS — H524 Presbyopia: Secondary | ICD-10-CM | POA: Diagnosis not present

## 2019-11-04 DIAGNOSIS — Z20822 Contact with and (suspected) exposure to covid-19: Secondary | ICD-10-CM | POA: Diagnosis not present

## 2019-11-04 DIAGNOSIS — H2513 Age-related nuclear cataract, bilateral: Secondary | ICD-10-CM | POA: Diagnosis not present

## 2019-11-04 DIAGNOSIS — E7800 Pure hypercholesterolemia, unspecified: Secondary | ICD-10-CM | POA: Diagnosis not present

## 2019-11-04 DIAGNOSIS — Z01812 Encounter for preprocedural laboratory examination: Secondary | ICD-10-CM | POA: Diagnosis not present

## 2019-11-04 DIAGNOSIS — Z0100 Encounter for examination of eyes and vision without abnormal findings: Secondary | ICD-10-CM | POA: Diagnosis not present

## 2019-11-04 DIAGNOSIS — H50111 Monocular exotropia, right eye: Secondary | ICD-10-CM | POA: Diagnosis not present

## 2019-11-04 DIAGNOSIS — I10 Essential (primary) hypertension: Secondary | ICD-10-CM | POA: Diagnosis not present

## 2019-11-04 LAB — CBC WITH DIFFERENTIAL/PLATELET
Basophils Absolute: 0 10*3/uL (ref 0.0–0.2)
Basos: 1 %
EOS (ABSOLUTE): 0.2 10*3/uL (ref 0.0–0.4)
Eos: 4 %
Hematocrit: 39.8 % (ref 34.0–46.6)
Hemoglobin: 13.2 g/dL (ref 11.1–15.9)
Immature Grans (Abs): 0 10*3/uL (ref 0.0–0.1)
Immature Granulocytes: 0 %
Lymphocytes Absolute: 1.1 10*3/uL (ref 0.7–3.1)
Lymphs: 23 %
MCH: 30.9 pg (ref 26.6–33.0)
MCHC: 33.2 g/dL (ref 31.5–35.7)
MCV: 93 fL (ref 79–97)
Monocytes Absolute: 0.5 10*3/uL (ref 0.1–0.9)
Monocytes: 11 %
Neutrophils Absolute: 2.7 10*3/uL (ref 1.4–7.0)
Neutrophils: 61 %
Platelets: 332 10*3/uL (ref 150–450)
RBC: 4.27 x10E6/uL (ref 3.77–5.28)
RDW: 14.6 % (ref 11.7–15.4)
WBC: 4.5 10*3/uL (ref 3.4–10.8)

## 2019-11-04 LAB — SARS CORONAVIRUS 2 (TAT 6-24 HRS): SARS Coronavirus 2: NEGATIVE

## 2019-11-06 ENCOUNTER — Encounter
Admission: RE | Disposition: A | Discharge status: Home / Self Care | Payer: Self-pay | Source: Home / Self Care | Attending: Gastroenterology

## 2019-11-06 ENCOUNTER — Ambulatory Visit: Payer: Medicare HMO | Admitting: Anesthesiology

## 2019-11-06 ENCOUNTER — Ambulatory Visit
Admission: RE | Admit: 2019-11-06 | Discharge: 2019-11-06 | Disposition: A | Discharge status: Home / Self Care | Payer: Medicare HMO | Attending: Gastroenterology | Admitting: Gastroenterology

## 2019-11-06 ENCOUNTER — Encounter: Payer: Self-pay | Admitting: Gastroenterology

## 2019-11-06 ENCOUNTER — Other Ambulatory Visit: Payer: Self-pay

## 2019-11-06 DIAGNOSIS — K921 Melena: Secondary | ICD-10-CM | POA: Diagnosis not present

## 2019-11-06 DIAGNOSIS — I129 Hypertensive chronic kidney disease with stage 1 through stage 4 chronic kidney disease, or unspecified chronic kidney disease: Secondary | ICD-10-CM | POA: Diagnosis not present

## 2019-11-06 DIAGNOSIS — D125 Benign neoplasm of sigmoid colon: Secondary | ICD-10-CM | POA: Insufficient documentation

## 2019-11-06 DIAGNOSIS — D124 Benign neoplasm of descending colon: Secondary | ICD-10-CM | POA: Diagnosis not present

## 2019-11-06 DIAGNOSIS — E559 Vitamin D deficiency, unspecified: Secondary | ICD-10-CM | POA: Diagnosis not present

## 2019-11-06 DIAGNOSIS — D126 Benign neoplasm of colon, unspecified: Secondary | ICD-10-CM | POA: Diagnosis not present

## 2019-11-06 DIAGNOSIS — K5730 Diverticulosis of large intestine without perforation or abscess without bleeding: Secondary | ICD-10-CM | POA: Insufficient documentation

## 2019-11-06 DIAGNOSIS — E785 Hyperlipidemia, unspecified: Secondary | ICD-10-CM | POA: Insufficient documentation

## 2019-11-06 DIAGNOSIS — N182 Chronic kidney disease, stage 2 (mild): Secondary | ICD-10-CM | POA: Diagnosis not present

## 2019-11-06 DIAGNOSIS — E538 Deficiency of other specified B group vitamins: Secondary | ICD-10-CM | POA: Insufficient documentation

## 2019-11-06 DIAGNOSIS — Z1211 Encounter for screening for malignant neoplasm of colon: Secondary | ICD-10-CM

## 2019-11-06 DIAGNOSIS — I5189 Other ill-defined heart diseases: Secondary | ICD-10-CM | POA: Insufficient documentation

## 2019-11-06 DIAGNOSIS — K5790 Diverticulosis of intestine, part unspecified, without perforation or abscess without bleeding: Secondary | ICD-10-CM | POA: Diagnosis not present

## 2019-11-06 DIAGNOSIS — K635 Polyp of colon: Secondary | ICD-10-CM

## 2019-11-06 HISTORY — PX: COLONOSCOPY WITH PROPOFOL: SHX5780

## 2019-11-06 SURGERY — COLONOSCOPY WITH PROPOFOL
Anesthesia: General

## 2019-11-06 MED ORDER — SODIUM CHLORIDE 0.9 % IV SOLN: INTRAVENOUS | Status: DC

## 2019-11-06 MED ORDER — PROPOFOL 500 MG/50ML IV EMUL
INTRAVENOUS | Status: DC | PRN
  Administered 2019-11-06: 120 ug/kg/min via INTRAVENOUS

## 2019-11-06 MED ORDER — PROPOFOL 10 MG/ML IV BOLUS
INTRAVENOUS | Status: DC | PRN
  Administered 2019-11-06: 100 mg via INTRAVENOUS

## 2019-11-06 NOTE — H&P (Signed)
[name], MD [address] [place], [place], [place], [address] [address], [place], [place], [place], [address] Phone: [phone]  Fax: [phone]  Primary Care Physician:  Delsa Grana, PA-C   Pre-Procedure History & Physical: HPI:  Sarah Gillespie is a 69 y.o. female is here for an colonoscopy.   Past Medical History:  Diagnosis Date  . Angular cheilitis   . Anxiety   . Cardiac murmur 03/05/2017  . Carotid atherosclerosis   . Carotid bruit   . CKD (chronic kidney disease) stage 2, GFR 60-89 ml/min   . Depression   . Elevated blood pressure   . Frequency   . Grade I diastolic dysfunction 0/03/7492   Echo Jan 2019  . Gross hematuria   . Hyperlipidemia   . Hypertension   . Nocturia   . Pyuria   . Sciatica   . Sigmoid diverticulosis Oct. 2015   CT scan  . Vitamin B12 deficiency   . Vitamin D deficiency disease     Past Surgical History:  Procedure Laterality Date  . ABDOMINAL HYSTERECTOMY  1987  . APPENDECTOMY  1960  . CARPAL TUNNEL RELEASE Left 1988  . EXCISION NEUROMA Left 1988  . TONSILLECTOMY  1962    Prior to Admission medications   Medication Sig Start Date End Date Taking? Authorizing Provider  CALCIUM PO Take 500 mg by mouth daily.     [provider]  Cholecalciferol (VITAMIN D) 2000 UNITS CAPS Take 2,000 Units by mouth daily.     [provider]  ezetimibe (ZETIA) 10 MG tablet Take 1 tablet (10 mg total) by mouth daily. 08/11/19   Delsa Grana, PA-C  polyethylene glycol (MIRALAX / GLYCOLAX) 17 g packet Take 17 g by mouth daily as needed for moderate constipation. 08/31/19   Loletha Grayer, MD  rosuvastatin (CRESTOR) 40 MG tablet Take 1 tablet (40 mg total) by mouth daily. 08/11/19   Delsa Grana, PA-C  vitamin B-12 (CYANOCOBALAMIN) 1000 MCG tablet Take 1 tablet by mouth daily. 08/10/16   [provider]  Vitamin D, Ergocalciferol, (DRISDOL) 1.25 MG (50000 UNIT) CAPS capsule Take 1 capsule (50,000 Units total) by  mouth every 7 (seven) days. x12 weeks. 08/11/19   Delsa Grana, PA-C    Allergies as of 11/03/2019  . (No Known Allergies)    Family History  Problem Relation Age of Onset  . Hypertension Mother   . Heart disease Mother        CHF for months, aortic stenosis for years  . Clotting disorder Mother   . Aortic stenosis Mother   . Heart failure Mother   . Depression Mother   . Deep vein thrombosis Mother   . Other Sister        muscle disease, polymyositis  . Heart failure Sister   . Pneumonia Sister        cause of death  . ALS Father   . Urolithiasis Father   . Thyroid disease Father   . CVA Father   . Thyroid disease Sister   . Clotting disorder Sister   . Hyperlipidemia Sister   . Migraines Sister   . Deep vein thrombosis Sister   . Cancer Brother 50       lung, brain cancer  . Diabetes Maternal Grandmother   . Stroke Brother   . Heart murmur Brother        rheumatic fever    Social History   Socioeconomic History  . Marital status: Divorced  Spouse name: Not on file  . Number of children: 2  . Years of education: Not on file  . Highest education level: Some college, no degree  Occupational History  . Occupation: retired  Tobacco Use  . Smoking status: Never Smoker  . Smokeless tobacco: Never Used  Vaping Use  . Vaping Use: Never used  Substance and Sexual Activity  . Alcohol use: Yes    Comment: occasional wine  . Drug use: No  . Sexual activity: Not Currently    Partners: Male  Other Topics Concern  . Not on file  Social History Narrative  . Not on file   Social Determinants of Health   Financial Resource Strain: Low Risk   . Difficulty of Paying Living Expenses: Not hard at all  Food Insecurity: No Food Insecurity  . Worried About Charity fundraiser in the Last Year: Never true  . Ran Out of Food in the Last Year: Never true  Transportation Needs: No Transportation Needs  . Lack of Transportation (Medical): No  . Lack of Transportation  (Non-Medical): No  Physical Activity: Inactive  . Days of Exercise per Week: 0 days  . Minutes of Exercise per Session: 0 min  Stress: No Stress Concern Present  . Feeling of Stress : Not at all  Social Connections: Socially Isolated  . Frequency of Communication with Friends and Family: More than three times a week  . Frequency of Social Gatherings with Friends and Family: Three times a week  . Attends Religious Services: Never  . Active Member of Clubs or Organizations: No  . Attends Archivist Meetings: Never  . Marital Status: Divorced  Human resources officer Violence: Not At Risk  . Fear of Current or Ex-Partner: No  . Emotionally Abused: No  . Physically Abused: No  . Sexually Abused: No    Review of Systems: See HPI, otherwise negative ROS  Physical Exam: BP (!) 148/72   Pulse 85   Temp (!) 97.4 F (36.3 C) (Temporal)   Resp 17   Ht 4\' 11"  (1.499 m)   Wt 69.9 kg   SpO2 98%   BMI 31.10 kg/m  General:   Alert,  pleasant and cooperative in NAD Head:  Normocephalic and atraumatic. Neck:  Supple; no masses or thyromegaly. Lungs:  Clear throughout to auscultation, normal respiratory effort.    Heart:  +S1, +S2, Regular rate and rhythm, No edema. Abdomen:  Soft, nontender and nondistended. Normal bowel sounds, without guarding, and without rebound.   Neurologic:  Alert and  oriented x4;  grossly normal neurologically.  Impression/Plan: Sarah Gillespie is here for an colonoscopy to be performed for GI bleed evaluation  Risks, benefits, limitations, and alternatives regarding  colonoscopy have been reviewed with the patient.  Questions have been answered.  All parties agreeable.   [name], MD  11/06/2019, 8:44 AM

## 2019-11-06 NOTE — Op Note (Signed)
[facility] Gastroenterology Patient Name: Sarah Gillespie Procedure Date: 11/06/2019 8:50 AM MRN: [id number] Account #: [id number] Date of Birth: 16-Mar-1950 Admit Type: Outpatient Age: 69 Room: [facility] ENDO ROOM 3 Gender: Female Note Status: Finalized Procedure:             Colonoscopy Indications:           Hematochezia Providers:             [name] MD, MD Referring MD:          Delsa Grana (Referring MD) Medicines:             Monitored Anesthesia Care Complications:         No immediate complications. Procedure:             Pre-Anesthesia Assessment:                        - Prior to the procedure, a History and Physical was                         performed, and patient medications, allergies and                         sensitivities were reviewed. The patient's tolerance                         of previous anesthesia was reviewed.                        - The risks and benefits of the procedure and the                         sedation options and risks were discussed with the                         patient. All questions were answered and informed                         consent was obtained.                        - ASA Grade Assessment: II - A patient with mild                         systemic disease.                        After obtaining informed consent, the colonoscope was                         passed under direct vision. Throughout the procedure,                         the patient's blood pressure, pulse, and oxygen                         saturations were monitored continuously. The                         Colonoscope was introduced through the anus and  advanced to the the cecum, identified by the                         appendiceal orifice. The colonoscopy was performed                         with ease. The patient tolerated the procedure well.                         The quality of the bowel preparation was  excellent. Findings:      The perianal and digital rectal examinations were normal.      Multiple small-mouthed diverticula were found in the sigmoid colon.      Two sessile polyps were found in the sigmoid colon and descending colon.       The polyps were 7 to 9 mm in size. These polyps were removed with a cold       snare. Resection and retrieval were complete.      The exam was otherwise without abnormality on direct and retroflexion       views. Impression:            - Diverticulosis in the sigmoid colon.                        - Two 7 to 9 mm polyps in the sigmoid colon and in the                         descending colon, removed with a cold snare. Resected                         and retrieved.                        - The examination was otherwise normal on direct and                         retroflexion views. Recommendation:        - Discharge patient to home (with escort).                        - Resume previous diet.                        - Continue present medications.                        - Await pathology results.                        - Repeat colonoscopy for surveillance based on                         pathology results. Procedure Code(s):     --- Professional ---                        [phone], Colonoscopy, flexible; with removal of                         tumor(s), polyp(s), or other lesion(s) by snare  technique Diagnosis Code(s):     --- Professional ---                        K63.5, Polyp of colon                        K92.1, Melena (includes Hematochezia)                        K57.30, Diverticulosis of large intestine without                         perforation or abscess without bleeding CPT copyright 2019 American Medical Association. All rights reserved. The codes documented in this report are preliminary and upon coder review may  be revised to meet current compliance requirements. [name], MD [name] MD, MD 11/06/2019  9:17:48 AM This report has been signed electronically. Number of Addenda: 0 Note Initiated On: 11/06/2019 8:50 AM Scope Withdrawal Time: 0 hours 13 minutes 17 seconds  Total Procedure Duration: 0 hours 18 minutes 55 seconds  Estimated Blood Loss:  Estimated blood loss: none.      [facility]

## 2019-11-06 NOTE — Anesthesia Preprocedure Evaluation (Addendum)
Anesthesia Evaluation  Patient identified by MRN, date of birth, ID band Patient awake    Reviewed: Allergy & Precautions, NPO status , Patient's Chart, lab work & pertinent test results  History of Anesthesia Complications Negative for: history of anesthetic complications  Airway Mallampati: II  TM Distance: >3 FB Neck ROM: Full    Dental  (+) Edentulous Upper, Edentulous Lower   Pulmonary neg pulmonary ROS, neg sleep apnea, neg COPD, Patient abstained from smoking.Not current smoker,    Pulmonary exam normal breath sounds clear to auscultation       Cardiovascular Exercise Tolerance: Good METShypertension, (-) CAD and (-) Past MI (-) dysrhythmias  Rhythm:Regular Rate:Normal - Systolic murmurs TTE 0037: Study Conclusions   - Left ventricle: The cavity size was normal. There was mild  concentric hypertrophy. Systolic function was vigorous. The  estimated ejection fraction was in the range of 65% to 70%. Wall  motion was normal; there were no regional wall motion  abnormalities. Doppler parameters are consistent with abnormal  left ventricular relaxation (grade 1 diastolic dysfunction).  - Pulmonary arteries: Systolic pressure could not be accurately  estimated.    Neuro/Psych PSYCHIATRIC DISORDERS Anxiety Depression negative neurological ROS  negative psych ROS   GI/Hepatic neg GERD  ,(+)     (-) substance abuse  ,   Endo/Other  neg diabetes  Renal/GU Renal disease     Musculoskeletal   Abdominal   Peds  Hematology   Anesthesia Other Findings Past Medical History: No date: Angular cheilitis No date: Anxiety 03/05/2017: Cardiac murmur No date: Carotid atherosclerosis No date: Carotid bruit No date: CKD (chronic kidney disease) stage 2, GFR 60-89 ml/min No date: Depression No date: Elevated blood pressure No date: Frequency 0/48/8891: Grade I diastolic dysfunction     Comment:  Echo Jan  2019 No date: Gross hematuria No date: Hyperlipidemia No date: Hypertension No date: Nocturia No date: Pyuria No date: Sciatica Oct. 2015: Sigmoid diverticulosis     Comment:  CT scan No date: Vitamin B12 deficiency No date: Vitamin D deficiency disease  Reproductive/Obstetrics                            Anesthesia Physical Anesthesia Plan  ASA: II  Anesthesia Plan: General   Post-op Pain Management:    Induction: Intravenous  PONV Risk Score and Plan: 3 and Ondansetron, Propofol infusion and TIVA  Airway Management Planned: Nasal Cannula  Additional Equipment: None  Intra-op Plan:   Post-operative Plan:   Informed Consent: I have reviewed the patients History and Physical, chart, labs and discussed the procedure including the risks, benefits and alternatives for the proposed anesthesia with the patient or authorized representative who has indicated his/her understanding and acceptance.     Dental advisory given  Plan Discussed with: CRNA and Surgeon  Anesthesia Plan Comments: (Discussed risks of anesthesia with patient, including possibility of difficulty with spontaneous ventilation under anesthesia necessitating airway intervention, PONV, and rare risks such as cardiac or respiratory or neurological events. Patient understands.)        Anesthesia Quick Evaluation

## 2019-11-06 NOTE — Anesthesia Postprocedure Evaluation (Signed)
Anesthesia Post Note  Patient: Sarah Gillespie  Procedure(s) Performed: COLONOSCOPY WITH PROPOFOL (N/A )  Patient location during evaluation: Endoscopy Anesthesia Type: General Level of consciousness: awake and alert Pain management: pain level controlled Vital Signs Assessment: post-procedure vital signs reviewed and stable Respiratory status: spontaneous breathing, nonlabored ventilation, respiratory function stable and patient connected to nasal cannula oxygen Cardiovascular status: blood pressure returned to baseline and stable Postop Assessment: no apparent nausea or vomiting Anesthetic complications: no   No complications documented.   Last Vitals:  Vitals:   11/06/19 0929 11/06/19 0939  BP: 116/64 133/75  Pulse: 72 73  Resp: 16 16  Temp:    SpO2: 98% 98%    Last Pain:  Vitals:   11/06/19 0939  TempSrc:   PainSc: 0-No pain                 Arita Miss

## 2019-11-06 NOTE — Transfer of Care (Signed)
Immediate Anesthesia Transfer of Care Note  Patient: Sarah Gillespie  Procedure(s) Performed: COLONOSCOPY WITH PROPOFOL (N/A )  Patient Location: Endoscopy Unit  Anesthesia Type:General  Level of Consciousness: drowsy and patient cooperative  Airway & Oxygen Therapy: Patient Spontanous Breathing  Post-op Assessment: Report given to RN and Post -op Vital signs reviewed and stable  Post vital signs: Reviewed and stable  Last Vitals:  Vitals Value Taken Time  BP 92/42 11/06/19 0920  Temp 36.5 C 11/06/19 0920  Pulse 70 11/06/19 0921  Resp 17 11/06/19 0921  SpO2 97 % 11/06/19 0921  Vitals shown include unvalidated device data.  Last Pain:  Vitals:   11/06/19 0920  TempSrc:   PainSc: 0-No pain         Complications: No complications documented.

## 2019-11-07 ENCOUNTER — Encounter: Payer: Self-pay | Admitting: Gastroenterology

## 2019-11-07 LAB — SURGICAL PATHOLOGY

## 2019-11-11 ENCOUNTER — Encounter: Payer: Self-pay | Admitting: Gastroenterology

## 2020-02-06 ENCOUNTER — Ambulatory Visit: Payer: Medicare HMO | Admitting: Family Medicine

## 2020-04-20 ENCOUNTER — Ambulatory Visit: Payer: Medicare HMO

## 2021-12-01 IMAGING — CT CT ABD-PELV W/ CM
2 of 5 series · 11 of 46 positions shown, 12 images · IV contrast (APPLIED)
Comparison: CT 12/24/2013
COMPARISON: CT 12/24/2013

Addendum:
CLINICAL DATA: Left lower quadrant pain and rectal bleeding

EXAM:
CT ABDOMEN AND PELVIS WITH CONTRAST
TECHNIQUE: Multidetector CT imaging of the abdomen and pelvis was performed
using the standard protocol following bolus administration of
intravenous contrast.
CONTRAST:  100mL OMNIPAQUE IOHEXOL 300 MG/ML  SOLN

[Series 2: routine abd/pel with · axial · 0.78mm/px · z∈[-1070,-660]mm · 8 of 106 slices shown, 9 images]
[im 12/106  soft-tissue]
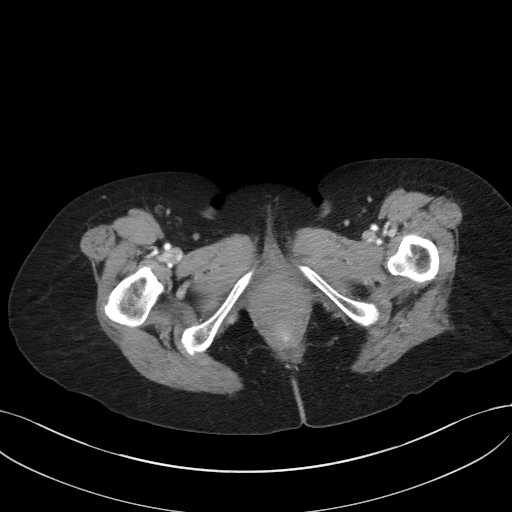
[im 12/106  bone]
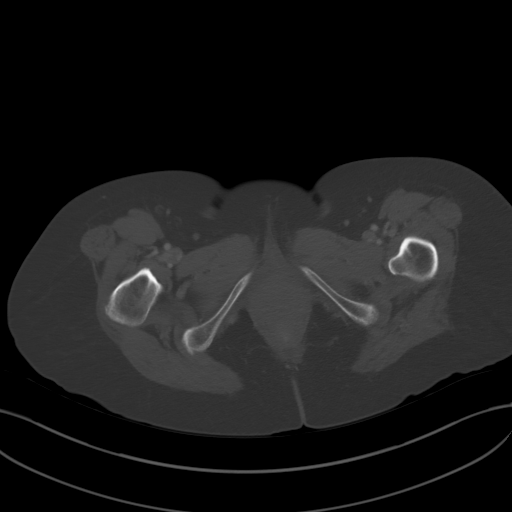
[im 23/106  soft-tissue]
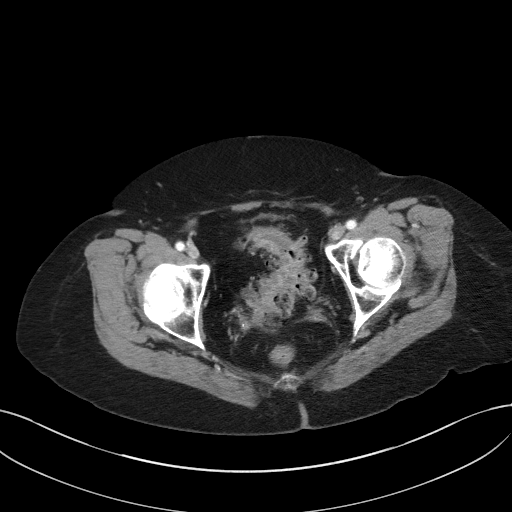
[im 34/106  soft-tissue]
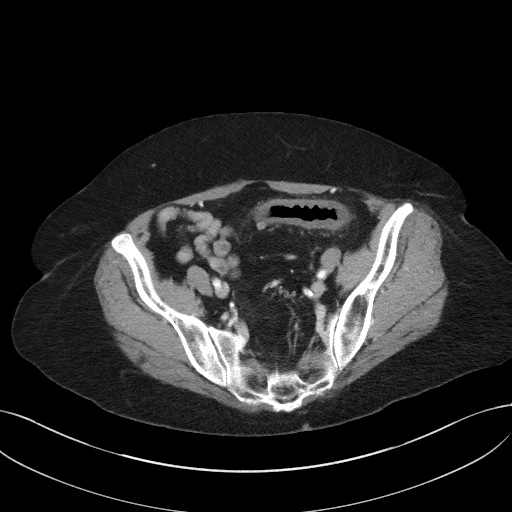
[im 45/106  soft-tissue]
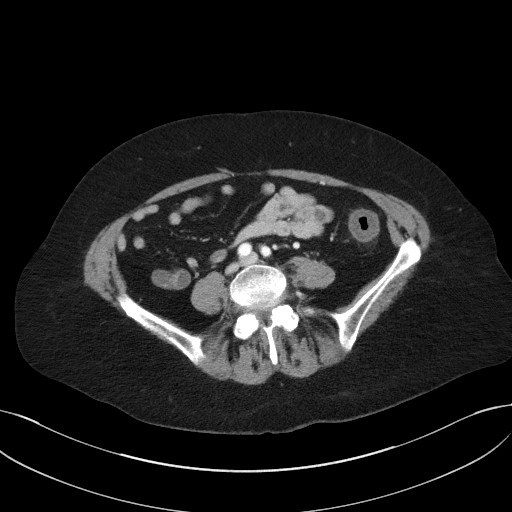
[im 61/106  soft-tissue]
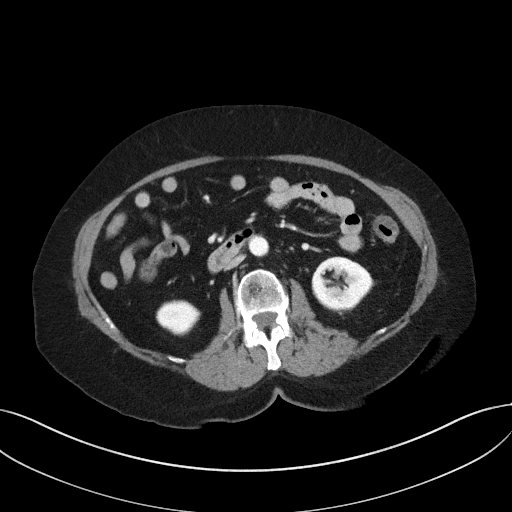
[im 72/106  soft-tissue]
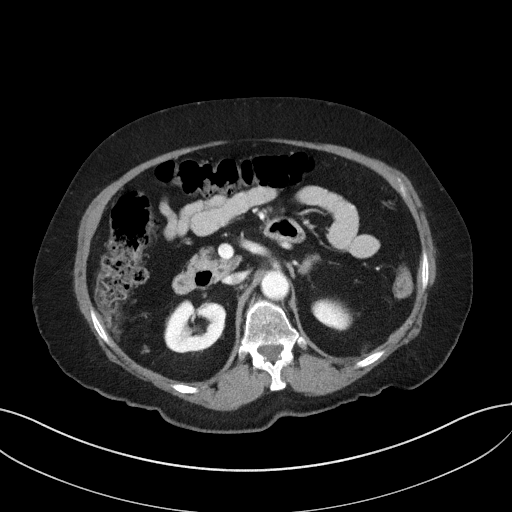
[im 83/106  soft-tissue]
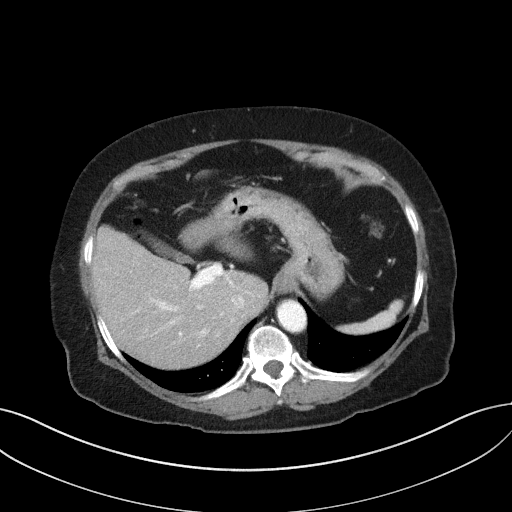
[im 94/106  soft-tissue]
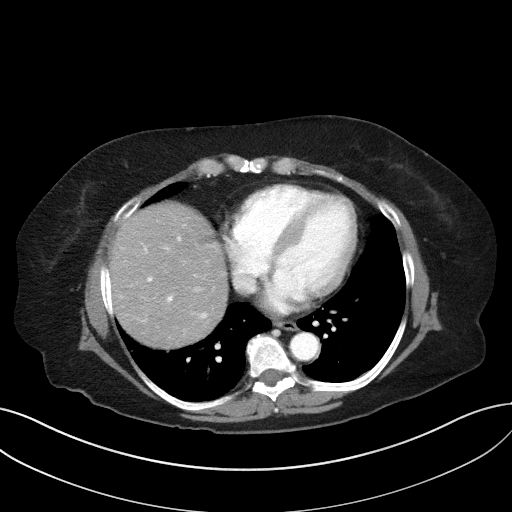

[Series 5: coronal st · coronal · 0.67mm/px · 3 of 70 slices shown]
[im 24/70  soft-tissue]
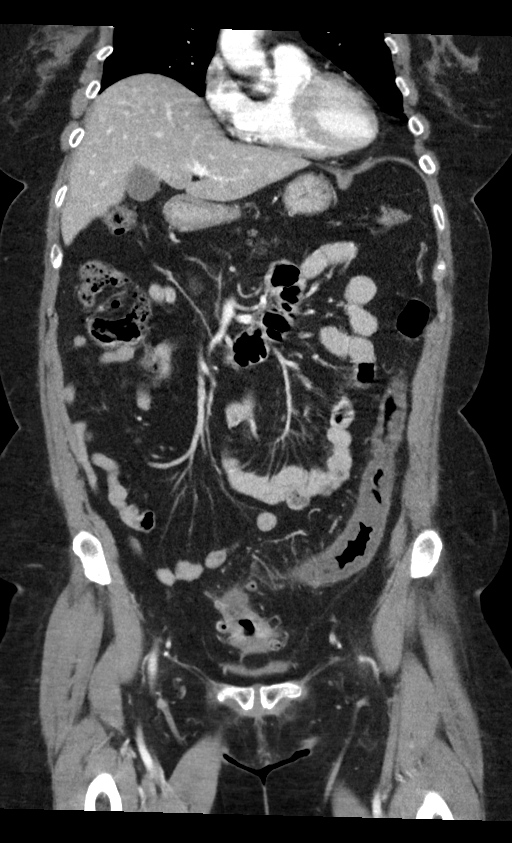
[im 31/70  soft-tissue]
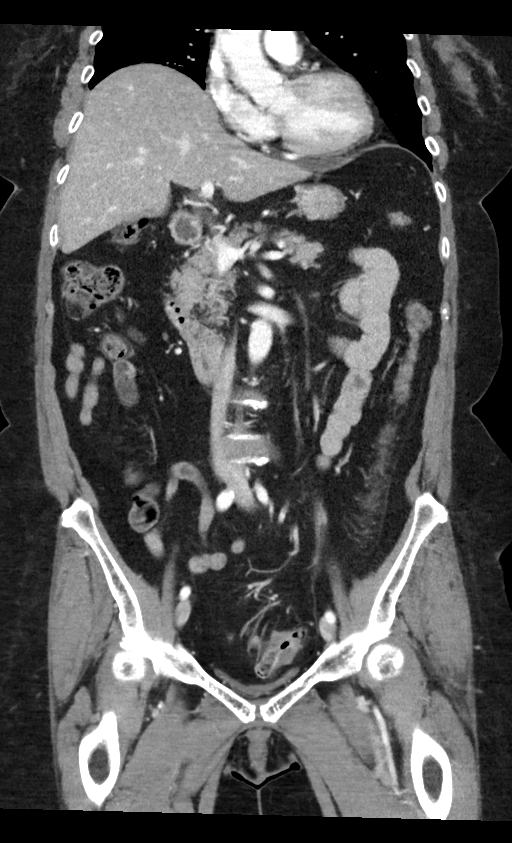
[im 39/70  soft-tissue]
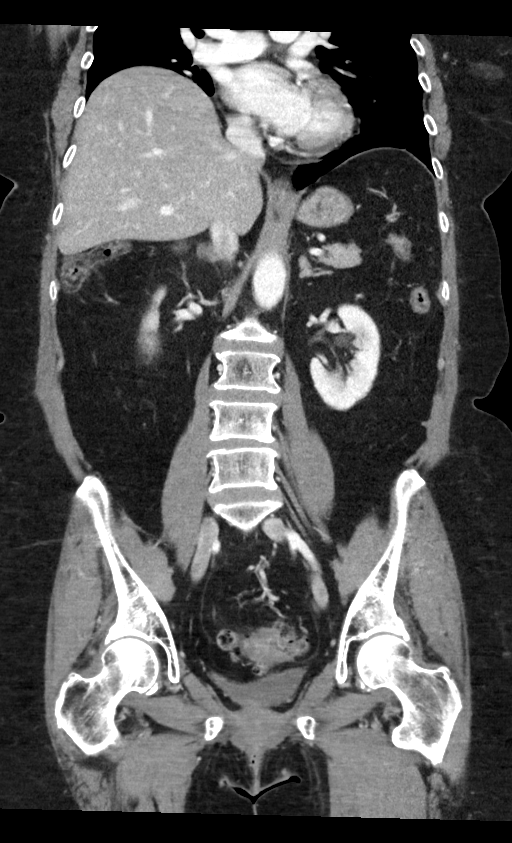

[11 of 46 positions shown; findings below may reference images not displayed]

FINDINGS: Lower chest: Atelectatic changes are present in the otherwise clear
lung bases. Cardiac size at the upper limits of normal. No
pericardial effusion. Few coronary artery calcifications are
present.

Hepatobiliary: Diffuse hepatic hypoattenuation compatible with
hepatic steatosis. Sparing seen along the gallbladder fossa. No
worrisome focal lesions. Smooth liver surface contour. Normal
gallbladder and biliary tree without visible intraductal gallstones.

Pancreas: Partial fatty replacement of the pancreas with scattered
fatty clefts. No ductal dilatation or inflammation. No concerning
pancreatic lesions.

Spleen: Normal in size without focal abnormality.

Adrenals/Urinary Tract: Normal adrenal glands. Kidneys enhance and
excrete symmetrically. Few subcentimeter hypoattenuating foci too
small to fully characterize on CT imaging but statistically likely
benign. No concerning renal lesions. No urolithiasis or
hydronephrosis. Urinary bladder is largely decompressed at the time
of exam and therefore poorly evaluated by CT imaging. No gross
bladder abnormality.

Stomach/Bowel: Distal esophagus, stomach and duodenum are
unremarkable. No small bowel thickening or dilatation. Some mild
fecalization of distal small bowel contents without evidence of
mechanical obstruction. Cecum displaced into the right upper
quadrant. The appendix is surgically absent. Intramural fat within
the right colon is a nonspecific finding. There is long segmental
thickening of the colon from the level of the proximal descending to
the rectum. There is edematous mural changes and pericolonic
stranding. Numerous colonic diverticular seen throughout the sigmoid
without focal diverticular inflammation.

Vascular/Lymphatic: Minimal atherosclerotic plaque in the abdominal
aorta and branch vessels. No visible proximal occlusions within the
limitations of this non angiographic technique. Major venous
structures are unremarkable. No suspicious or enlarged lymph nodes
in the included lymphatic chains.

Reproductive: Uterus is surgically absent. No concerning adnexal
lesions.

Other: No abdominopelvic free fluid or free gas. No bowel containing
hernias. Small fat containing umbilical hernia.

Musculoskeletal: No acute osseous abnormality or suspicious osseous
lesion. Multilevel degenerative changes are present in the imaged
portions of the spine. Additional degenerative changes in the hips
and pelvis. Mild dextrocurvature of the mid lumbar spine.
IMPRESSION: 1. Long segmental thickening of the colon from the level of the
proximal descending to the rectum with edematous mural changes and
pericolonic stranding, consistent with colitis, which could be
infectious, inflammatory or vascular in etiology.
2. Minimal atherosclerotic disease without discernible proximal
arterial occlusions within the limitations of this non angiographic
technique.
3. Colonic diverticulosis without evidence of acute diverticulitis.
4. Hepatic steatosis.
5. Aortic Atherosclerosis ([id number]).

ADDENDUM:
This pattern of inflammation is quite nonspecific though does
present in and IMA distribution extending from the splenic flexure
to the rectum. However, to involve such a large distribution, an
acute vascular occlusion would need to be quite proximal to involve
the branches so diffusely which is not apparent on this exam.
Transient hypotension or low flow states would more likely affect
the watershed distributions Sudeck's point at the rectosigmoid or
Mosiejus point about the splenic flexure. However, if there is
persisting clinical suspicion for possible ischemic colitis,
consider obtaining a lactate and correlating with patient's other
laboratory values prior to proceeding with further imaging.

Addendum called by telephone on 08/29/2019 at [DATE] to provider
DON LOLITO ONACRAM , who verbally acknowledged these results.

*** End of Addendum ***
FINDINGS: Lower chest: Atelectatic changes are present in the otherwise clear
lung bases. Cardiac size at the upper limits of normal. No
pericardial effusion. Few coronary artery calcifications are
present.

Hepatobiliary: Diffuse hepatic hypoattenuation compatible with
hepatic steatosis. Sparing seen along the gallbladder fossa. No
worrisome focal lesions. Smooth liver surface contour. Normal
gallbladder and biliary tree without visible intraductal gallstones.

Pancreas: Partial fatty replacement of the pancreas with scattered
fatty clefts. No ductal dilatation or inflammation. No concerning
pancreatic lesions.

Spleen: Normal in size without focal abnormality.

Adrenals/Urinary Tract: Normal adrenal glands. Kidneys enhance and
excrete symmetrically. Few subcentimeter hypoattenuating foci too
small to fully characterize on CT imaging but statistically likely
benign. No concerning renal lesions. No urolithiasis or
hydronephrosis. Urinary bladder is largely decompressed at the time
of exam and therefore poorly evaluated by CT imaging. No gross
bladder abnormality.

Stomach/Bowel: Distal esophagus, stomach and duodenum are
unremarkable. No small bowel thickening or dilatation. Some mild
fecalization of distal small bowel contents without evidence of
mechanical obstruction. Cecum displaced into the right upper
quadrant. The appendix is surgically absent. Intramural fat within
the right colon is a nonspecific finding. There is long segmental
thickening of the colon from the level of the proximal descending to
the rectum. There is edematous mural changes and pericolonic
stranding. Numerous colonic diverticular seen throughout the sigmoid
without focal diverticular inflammation.

Vascular/Lymphatic: Minimal atherosclerotic plaque in the abdominal
aorta and branch vessels. No visible proximal occlusions within the
limitations of this non angiographic technique. Major venous
structures are unremarkable. No suspicious or enlarged lymph nodes
in the included lymphatic chains.

Reproductive: Uterus is surgically absent. No concerning adnexal
lesions.

Other: No abdominopelvic free fluid or free gas. No bowel containing
hernias. Small fat containing umbilical hernia.

Musculoskeletal: No acute osseous abnormality or suspicious osseous
lesion. Multilevel degenerative changes are present in the imaged
portions of the spine. Additional degenerative changes in the hips
and pelvis. Mild dextrocurvature of the mid lumbar spine.
IMPRESSION: 1. Long segmental thickening of the colon from the level of the
proximal descending to the rectum with edematous mural changes and
pericolonic stranding, consistent with colitis, which could be
infectious, inflammatory or vascular in etiology.
2. Minimal atherosclerotic disease without discernible proximal
arterial occlusions within the limitations of this non angiographic
technique.
3. Colonic diverticulosis without evidence of acute diverticulitis.
4. Hepatic steatosis.
5. Aortic Atherosclerosis (8R3V0-8X8.8).
